# Patient Record
Sex: Female | Born: 1945 | Race: White | Hispanic: No | Marital: Married | Smoking: Never smoker
Health system: Southern US, Community
[De-identification: ages and names within clinical notes are randomized; demographics above are authoritative.]

## PROBLEM LIST (undated history)

## (undated) DIAGNOSIS — R053 Chronic cough: Secondary | ICD-10-CM

## (undated) DIAGNOSIS — J302 Other seasonal allergic rhinitis: Secondary | ICD-10-CM

## (undated) DIAGNOSIS — K5900 Constipation, unspecified: Secondary | ICD-10-CM

## (undated) DIAGNOSIS — G4730 Sleep apnea, unspecified: Secondary | ICD-10-CM

## (undated) DIAGNOSIS — L989 Disorder of the skin and subcutaneous tissue, unspecified: Secondary | ICD-10-CM

## (undated) DIAGNOSIS — I10 Essential (primary) hypertension: Secondary | ICD-10-CM

## (undated) DIAGNOSIS — N3281 Overactive bladder: Secondary | ICD-10-CM

## (undated) DIAGNOSIS — S83242A Other tear of medial meniscus, current injury, left knee, initial encounter: Secondary | ICD-10-CM

## (undated) DIAGNOSIS — K219 Gastro-esophageal reflux disease without esophagitis: Secondary | ICD-10-CM

## (undated) DIAGNOSIS — N951 Menopausal and female climacteric states: Secondary | ICD-10-CM

## (undated) DIAGNOSIS — M1990 Unspecified osteoarthritis, unspecified site: Secondary | ICD-10-CM

## (undated) DIAGNOSIS — L900 Lichen sclerosus et atrophicus: Secondary | ICD-10-CM

## (undated) DIAGNOSIS — M810 Age-related osteoporosis without current pathological fracture: Secondary | ICD-10-CM

## (undated) DIAGNOSIS — M47812 Spondylosis without myelopathy or radiculopathy, cervical region: Secondary | ICD-10-CM

## (undated) DIAGNOSIS — E559 Vitamin D deficiency, unspecified: Secondary | ICD-10-CM

## (undated) DIAGNOSIS — M5416 Radiculopathy, lumbar region: Secondary | ICD-10-CM

## (undated) DIAGNOSIS — Z973 Presence of spectacles and contact lenses: Secondary | ICD-10-CM

## (undated) DIAGNOSIS — M4802 Spinal stenosis, cervical region: Secondary | ICD-10-CM

## (undated) DIAGNOSIS — R600 Localized edema: Secondary | ICD-10-CM

## (undated) DIAGNOSIS — N390 Urinary tract infection, site not specified: Secondary | ICD-10-CM

## (undated) DIAGNOSIS — M5136 Other intervertebral disc degeneration, lumbar region: Secondary | ICD-10-CM

## (undated) DIAGNOSIS — M51369 Other intervertebral disc degeneration, lumbar region without mention of lumbar back pain or lower extremity pain: Secondary | ICD-10-CM

## (undated) DIAGNOSIS — G2581 Restless legs syndrome: Secondary | ICD-10-CM

## (undated) DIAGNOSIS — G8929 Other chronic pain: Secondary | ICD-10-CM

## (undated) DIAGNOSIS — R079 Chest pain, unspecified: Secondary | ICD-10-CM

## (undated) DIAGNOSIS — M545 Low back pain, unspecified: Secondary | ICD-10-CM

## (undated) HISTORY — DX: Other tear of medial meniscus, current injury, left knee, initial encounter: S83.242A

## (undated) HISTORY — DX: Overactive bladder: N32.81

## (undated) HISTORY — PX: ABDOMINAL HYSTERECTOMY: SHX81

## (undated) HISTORY — DX: Other intervertebral disc degeneration, lumbar region: M51.36

## (undated) HISTORY — DX: Menopausal and female climacteric states: N95.1

## (undated) HISTORY — DX: Chronic cough: R05.3

## (undated) HISTORY — DX: Essential (primary) hypertension: I10

## (undated) HISTORY — PX: BUNIONECTOMY: SHX129

## (undated) HISTORY — DX: Age-related osteoporosis without current pathological fracture: M81.0

## (undated) HISTORY — DX: Other intervertebral disc degeneration, lumbar region without mention of lumbar back pain or lower extremity pain: M51.369

## (undated) HISTORY — DX: Restless legs syndrome: G25.81

## (undated) HISTORY — DX: Localized edema: R60.0

## (undated) HISTORY — PX: SHOULDER SURGERY: SHX246

## (undated) HISTORY — DX: Low back pain, unspecified: M54.50

## (undated) HISTORY — DX: Radiculopathy, lumbar region: M54.16

## (undated) HISTORY — PX: OTHER SURGICAL HISTORY: SHX169

## (undated) HISTORY — DX: Chest pain, unspecified: R07.9

## (undated) HISTORY — DX: Vitamin D deficiency, unspecified: E55.9

## (undated) HISTORY — DX: Spondylosis without myelopathy or radiculopathy, cervical region: M47.812

## (undated) HISTORY — DX: Other seasonal allergic rhinitis: J30.2

## (undated) HISTORY — DX: Spinal stenosis, cervical region: M48.02

## (undated) HISTORY — DX: Constipation, unspecified: K59.00

## (undated) HISTORY — DX: Other chronic pain: G89.29

---

## 1996-01-18 HISTORY — PX: CERVICAL SPINE SURGERY: SHX589

## 1998-11-09 ENCOUNTER — Encounter: Admission: RE | Admit: 1998-11-09 | Discharge: 1998-11-09 | Payer: Self-pay | Admitting: Internal Medicine

## 1998-11-09 ENCOUNTER — Encounter: Payer: Self-pay | Admitting: Internal Medicine

## 1999-09-29 ENCOUNTER — Encounter: Payer: Self-pay | Admitting: Internal Medicine

## 1999-09-29 ENCOUNTER — Encounter: Admission: RE | Admit: 1999-09-29 | Discharge: 1999-09-29 | Payer: Self-pay | Admitting: Internal Medicine

## 1999-10-12 ENCOUNTER — Encounter: Payer: Self-pay | Admitting: Internal Medicine

## 1999-10-12 ENCOUNTER — Encounter: Admission: RE | Admit: 1999-10-12 | Discharge: 1999-10-12 | Payer: Self-pay | Admitting: Internal Medicine

## 2006-12-04 ENCOUNTER — Encounter: Admission: RE | Admit: 2006-12-04 | Discharge: 2006-12-04 | Payer: Self-pay | Admitting: Internal Medicine

## 2006-12-06 ENCOUNTER — Encounter: Admission: RE | Admit: 2006-12-06 | Discharge: 2006-12-06 | Payer: Self-pay | Admitting: Internal Medicine

## 2007-01-27 ENCOUNTER — Encounter: Admission: RE | Admit: 2007-01-27 | Discharge: 2007-01-27 | Payer: Self-pay | Admitting: Internal Medicine

## 2008-03-04 ENCOUNTER — Encounter: Admission: RE | Admit: 2008-03-04 | Discharge: 2008-03-04 | Payer: Self-pay | Admitting: Internal Medicine

## 2009-03-24 ENCOUNTER — Encounter: Admission: RE | Admit: 2009-03-24 | Discharge: 2009-03-24 | Payer: Self-pay | Admitting: Internal Medicine

## 2009-08-03 ENCOUNTER — Encounter: Admission: RE | Admit: 2009-08-03 | Discharge: 2009-08-03 | Payer: Self-pay | Admitting: Internal Medicine

## 2010-02-07 ENCOUNTER — Encounter: Payer: Self-pay | Admitting: Internal Medicine

## 2011-05-04 ENCOUNTER — Other Ambulatory Visit: Payer: Self-pay | Admitting: Internal Medicine

## 2011-05-04 DIAGNOSIS — Z1231 Encounter for screening mammogram for malignant neoplasm of breast: Secondary | ICD-10-CM

## 2011-05-17 ENCOUNTER — Ambulatory Visit: Payer: Self-pay

## 2011-07-20 ENCOUNTER — Ambulatory Visit: Payer: Self-pay

## 2011-07-27 ENCOUNTER — Ambulatory Visit
Admission: RE | Admit: 2011-07-27 | Discharge: 2011-07-27 | Disposition: A | Discharge status: Home / Self Care | Payer: Self-pay | Source: Ambulatory Visit | Attending: Internal Medicine | Admitting: Internal Medicine

## 2011-07-27 DIAGNOSIS — Z1231 Encounter for screening mammogram for malignant neoplasm of breast: Secondary | ICD-10-CM

## 2012-01-18 HISTORY — PX: EYE SURGERY: SHX253

## 2012-05-17 ENCOUNTER — Other Ambulatory Visit (HOSPITAL_COMMUNITY)
Admission: RE | Admit: 2012-05-17 | Discharge: 2012-05-17 | Disposition: A | Discharge status: Home / Self Care | Payer: PRIVATE HEALTH INSURANCE | Source: Ambulatory Visit | Attending: Nurse Practitioner | Admitting: Nurse Practitioner

## 2012-05-17 ENCOUNTER — Other Ambulatory Visit: Payer: Self-pay | Admitting: Nurse Practitioner

## 2012-05-17 DIAGNOSIS — Z124 Encounter for screening for malignant neoplasm of cervix: Secondary | ICD-10-CM | POA: Insufficient documentation

## 2012-05-17 DIAGNOSIS — Z1151 Encounter for screening for human papillomavirus (HPV): Secondary | ICD-10-CM | POA: Insufficient documentation

## 2012-06-04 ENCOUNTER — Other Ambulatory Visit: Payer: Self-pay | Admitting: Gastroenterology

## 2012-06-08 ENCOUNTER — Encounter (HOSPITAL_COMMUNITY): Payer: Self-pay | Admitting: *Deleted

## 2012-06-20 ENCOUNTER — Encounter (HOSPITAL_COMMUNITY): Payer: Self-pay | Admitting: Pharmacy Technician

## 2012-07-03 ENCOUNTER — Encounter (HOSPITAL_COMMUNITY): Payer: Self-pay | Admitting: Anesthesiology

## 2012-07-03 ENCOUNTER — Encounter (HOSPITAL_COMMUNITY)
Admission: RE | Disposition: A | Discharge status: Home / Self Care | Payer: Self-pay | Source: Ambulatory Visit | Attending: Gastroenterology

## 2012-07-03 ENCOUNTER — Ambulatory Visit (HOSPITAL_COMMUNITY): Payer: Medicare Other | Admitting: Anesthesiology

## 2012-07-03 ENCOUNTER — Encounter (HOSPITAL_COMMUNITY): Payer: Self-pay

## 2012-07-03 ENCOUNTER — Ambulatory Visit (HOSPITAL_COMMUNITY)
Admission: RE | Admit: 2012-07-03 | Discharge: 2012-07-03 | Disposition: A | Discharge status: Home / Self Care | Payer: Medicare Other | Source: Ambulatory Visit | Attending: Gastroenterology | Admitting: Gastroenterology

## 2012-07-03 DIAGNOSIS — K5730 Diverticulosis of large intestine without perforation or abscess without bleeding: Secondary | ICD-10-CM | POA: Insufficient documentation

## 2012-07-03 DIAGNOSIS — K921 Melena: Secondary | ICD-10-CM | POA: Insufficient documentation

## 2012-07-03 DIAGNOSIS — Z79899 Other long term (current) drug therapy: Secondary | ICD-10-CM | POA: Insufficient documentation

## 2012-07-03 DIAGNOSIS — Z90710 Acquired absence of both cervix and uterus: Secondary | ICD-10-CM | POA: Insufficient documentation

## 2012-07-03 DIAGNOSIS — K219 Gastro-esophageal reflux disease without esophagitis: Secondary | ICD-10-CM | POA: Insufficient documentation

## 2012-07-03 DIAGNOSIS — E785 Hyperlipidemia, unspecified: Secondary | ICD-10-CM | POA: Insufficient documentation

## 2012-07-03 HISTORY — PX: COLONOSCOPY WITH PROPOFOL: SHX5780

## 2012-07-03 HISTORY — DX: Unspecified osteoarthritis, unspecified site: M19.90

## 2012-07-03 HISTORY — DX: Lichen sclerosus et atrophicus: L90.0

## 2012-07-03 HISTORY — DX: Gastro-esophageal reflux disease without esophagitis: K21.9

## 2012-07-03 SURGERY — COLONOSCOPY WITH PROPOFOL
Anesthesia: Monitor Anesthesia Care

## 2012-07-03 MED ORDER — SODIUM CHLORIDE 0.9 % IV SOLN: INTRAVENOUS | Status: DC

## 2012-07-03 MED ORDER — PROPOFOL INFUSION 10 MG/ML OPTIME
INTRAVENOUS | Status: DC | PRN
  Administered 2012-07-03: 120 ug/kg/min via INTRAVENOUS

## 2012-07-03 MED ORDER — PROPOFOL 10 MG/ML IV BOLUS
INTRAVENOUS | Status: DC | PRN
  Administered 2012-07-03: 30 mg via INTRAVENOUS

## 2012-07-03 MED ORDER — KETAMINE HCL 10 MG/ML IJ SOLN
INTRAMUSCULAR | Status: DC | PRN
  Administered 2012-07-03: 20 mg via INTRAVENOUS

## 2012-07-03 MED ORDER — LACTATED RINGERS IV SOLN
INTRAVENOUS | Status: DC | PRN
  Administered 2012-07-03: 08:00:00 via INTRAVENOUS

## 2012-07-03 SURGICAL SUPPLY — 21 items

## 2012-07-03 NOTE — H&P (Signed)
  Problem: Resolved hematochezia on aspirin.  History: The patient is a 67 year old female born 07/03/1945. The patient tells me she [place] ago. In February 2011, the patient underwent a single contrast barium enema which showed colonic diverticulosis.  The patient has spotted blood on her underwear followed by a few episodes of more significant lower gastrointestinal bleeding manifested by blood soiling her underwear. She reports no abdominal pain, rectal pain, or anal pain. She reports no bleeding with bowel movements. She recently underwent a gynecologic exam with biopsies performed.  The patient thinks her bleeding came from the gastrointestinal tract.  The patient is scheduled to undergo a diagnostic colonoscopy.  Past medical and surgical history: Hyperlipidemia. Allergic rhinitis. Vitamin D. deficiency. Gout. Colonic diverticulosis degenerative joint disease. Restless leg syndrome. Gastroesophageal reflux. Total abdominal hysterectomy.  Medication allergies: Lodine. Hydrochlorothiazide. Penicillin. Diclofenac.  Exam: The patient is alert and lying comfortably on the endoscopy stretcher. Abdomen is soft, flat, and nontender to palpation. Cardiac exam reveals a regular rhythm. Lungs are clear to auscultation.  Plan: Proceed with diagnostic colonoscopy.

## 2012-07-03 NOTE — Preoperative (Signed)
Beta Blockers   Reason not to administer Beta Blockers:Not Applicable 

## 2012-07-03 NOTE — Transfer of Care (Signed)
Immediate Anesthesia Transfer of Care Note  Patient: Dana Velazquez  Procedure(s) Performed: Procedure(s): COLONOSCOPY WITH PROPOFOL (N/A)  Patient Location: PACU  Anesthesia Type:MAC  Level of Consciousness: awake, alert , oriented and patient cooperative  Airway & Oxygen Therapy: Patient Spontanous Breathing and Patient connected to face mask oxygen  Post-op Assessment: Report given to PACU RN and Post -op Vital signs reviewed and stable  Post vital signs: Reviewed and stable  Complications: No apparent anesthesia complications

## 2012-07-03 NOTE — Anesthesia Preprocedure Evaluation (Addendum)
Anesthesia Evaluation  Patient identified by MRN, date of birth, ID band Patient awake    Reviewed: Allergy & Precautions, H&P , NPO status , Patient's Chart, lab work & pertinent test results  Airway       Dental   Pulmonary neg pulmonary ROS,          Cardiovascular negative cardio ROS      Neuro/Psych negative neurological ROS  negative psych ROS   GI/Hepatic Neg liver ROS, GERD-  Medicated,  Endo/Other  negative endocrine ROS  Renal/GU negative Renal ROS     Musculoskeletal negative musculoskeletal ROS (+)   Abdominal   Peds  Hematology negative hematology ROS (+)   Anesthesia Other Findings   Reproductive/Obstetrics negative OB ROS                           Anesthesia Physical Anesthesia Plan Anesthesia Quick Evaluation

## 2012-07-03 NOTE — Op Note (Signed)
Procedure: Diagnostic colonoscopy to evaluate resolved hematochezia  Endoscopist: Danise Edge  Premedication: Propofol administered by anesthesia  Procedure: The patient was placed in the left lateral decubitus position. Anal inspection and digital rectal exam were normal. The Pentax pediatric colonoscope was introduced into the rectum and advanced to the cecum. A normal-appearing ileocecal valve and appendiceal orifice were identified. Colonic preparation for the exam today was good.  Rectum. Normal. Retroflexed view of the distal rectum normal.  Sigmoid colon and descending colon. Sigmoid colonic diverticulosis.  Splenic flexure. Normal.  Transverse colon. Normal.  Hepatic flexure. Normal.  Ascending colon. Normal.  Cecum and ileocecal valve. Normal.  Assessment: Normal diagnostic proctocolonoscopy to the cecum except for the presence of sigmoid colonic diverticulosis.  Recommendations: Schedule repeat screening colonoscopy in 10 years if his health allows.

## 2012-07-03 NOTE — Anesthesia Postprocedure Evaluation (Signed)
Anesthesia Post Note  Patient: Dana Velazquez  Procedure(s) Performed: Procedure(s) (LRB): COLONOSCOPY WITH PROPOFOL (N/A)  Anesthesia type: MAC  Patient location: PACU  Post pain: Pain level controlled  Post assessment: Post-op Vital signs reviewed  Last Vitals: BP 121/45  Pulse 67  Temp(Src) 36.7 C (Oral)  Resp 12  Ht 5' (1.524 m)  Wt 181 lb (82.101 kg)  BMI 35.35 kg/m2  SpO2 100%  Post vital signs: Reviewed  Level of consciousness: awake  Complications: No apparent anesthesia complications

## 2012-07-04 ENCOUNTER — Encounter (HOSPITAL_COMMUNITY): Payer: Self-pay | Admitting: Gastroenterology

## 2012-07-06 ENCOUNTER — Other Ambulatory Visit: Payer: Self-pay

## 2012-07-06 DIAGNOSIS — Z1231 Encounter for screening mammogram for malignant neoplasm of breast: Secondary | ICD-10-CM

## 2012-07-30 ENCOUNTER — Ambulatory Visit: Payer: Medicare Other

## 2012-07-31 ENCOUNTER — Ambulatory Visit
Admission: RE | Admit: 2012-07-31 | Discharge: 2012-07-31 | Disposition: A | Discharge status: Home / Self Care | Payer: Medicare Other | Source: Ambulatory Visit

## 2012-07-31 DIAGNOSIS — Z1231 Encounter for screening mammogram for malignant neoplasm of breast: Secondary | ICD-10-CM

## 2013-07-09 ENCOUNTER — Other Ambulatory Visit: Payer: Self-pay

## 2013-07-09 DIAGNOSIS — Z1231 Encounter for screening mammogram for malignant neoplasm of breast: Secondary | ICD-10-CM

## 2013-08-05 ENCOUNTER — Ambulatory Visit
Admission: RE | Admit: 2013-08-05 | Discharge: 2013-08-05 | Disposition: A | Discharge status: Home / Self Care | Payer: 59 | Source: Ambulatory Visit

## 2013-08-05 DIAGNOSIS — Z1231 Encounter for screening mammogram for malignant neoplasm of breast: Secondary | ICD-10-CM

## 2014-01-12 IMAGING — MG MM DIGITAL SCREENING BILAT W/ CAD
6 series · 6 of 6 positions shown · non-contrast
Comparison: Previous exam(s).

CLINICAL DATA: Screening.

DIGITAL SCREENING BILATERAL MAMMOGRAM WITH CAD

[R CC]
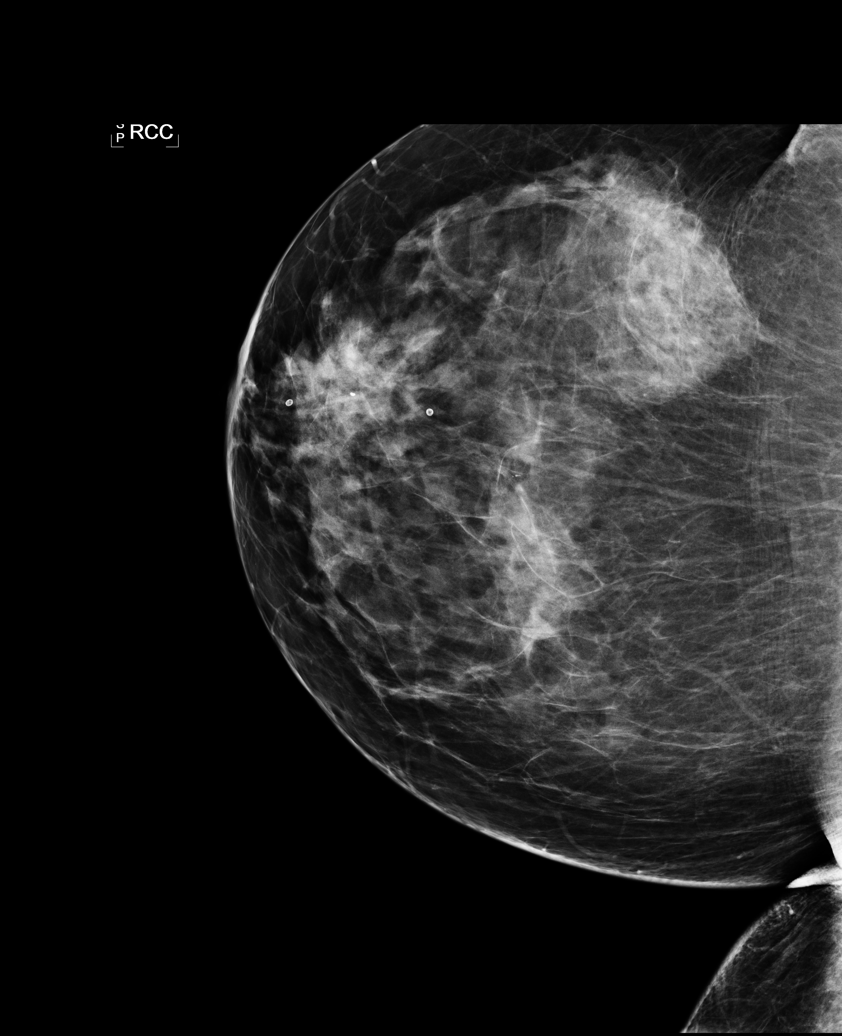

[L CC]
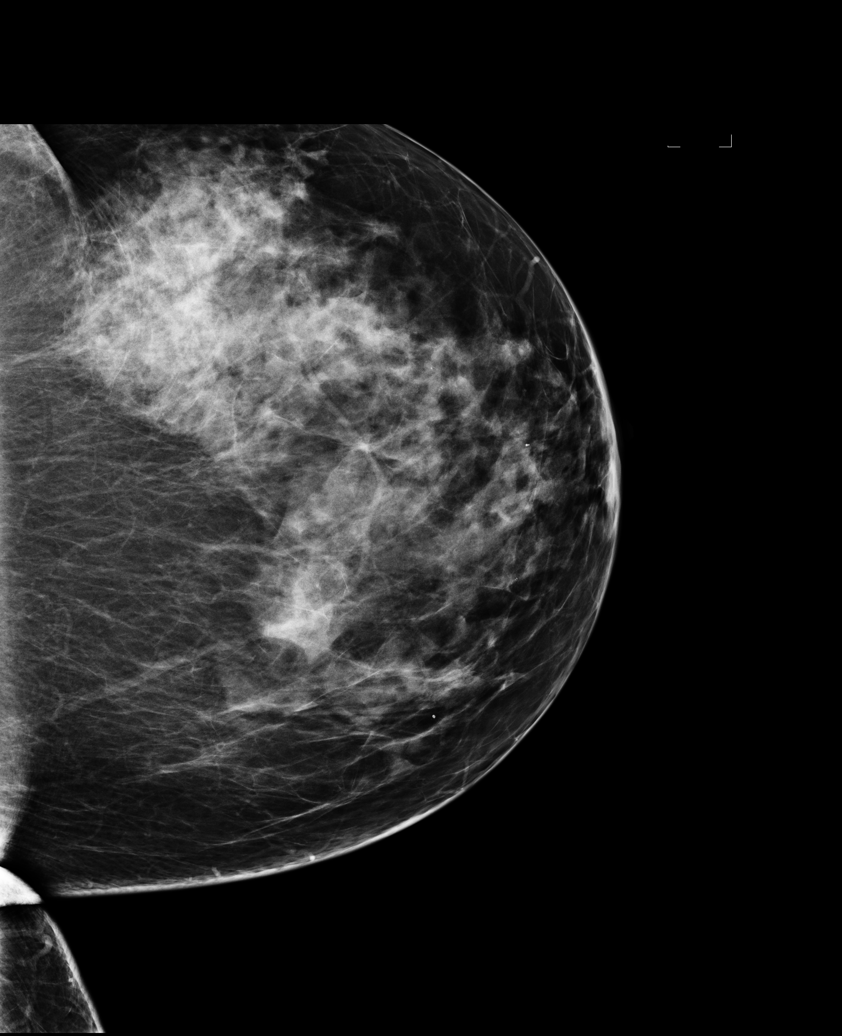

[L MLO]
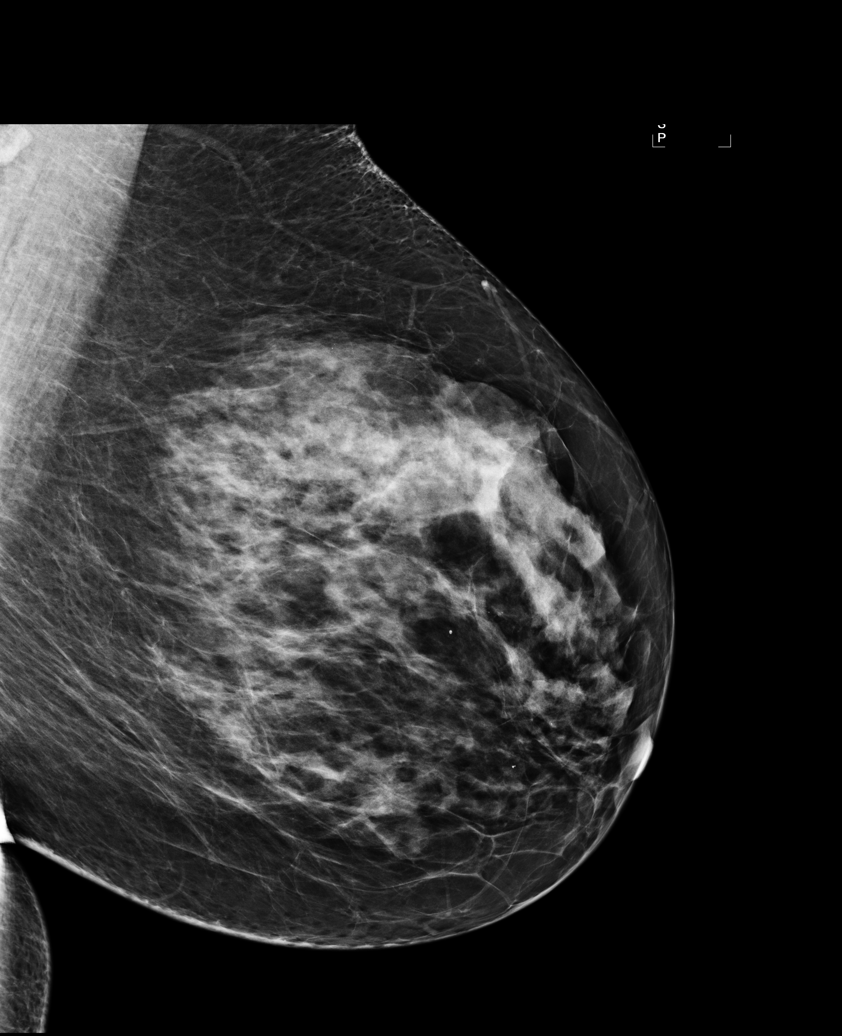

[R MLO]
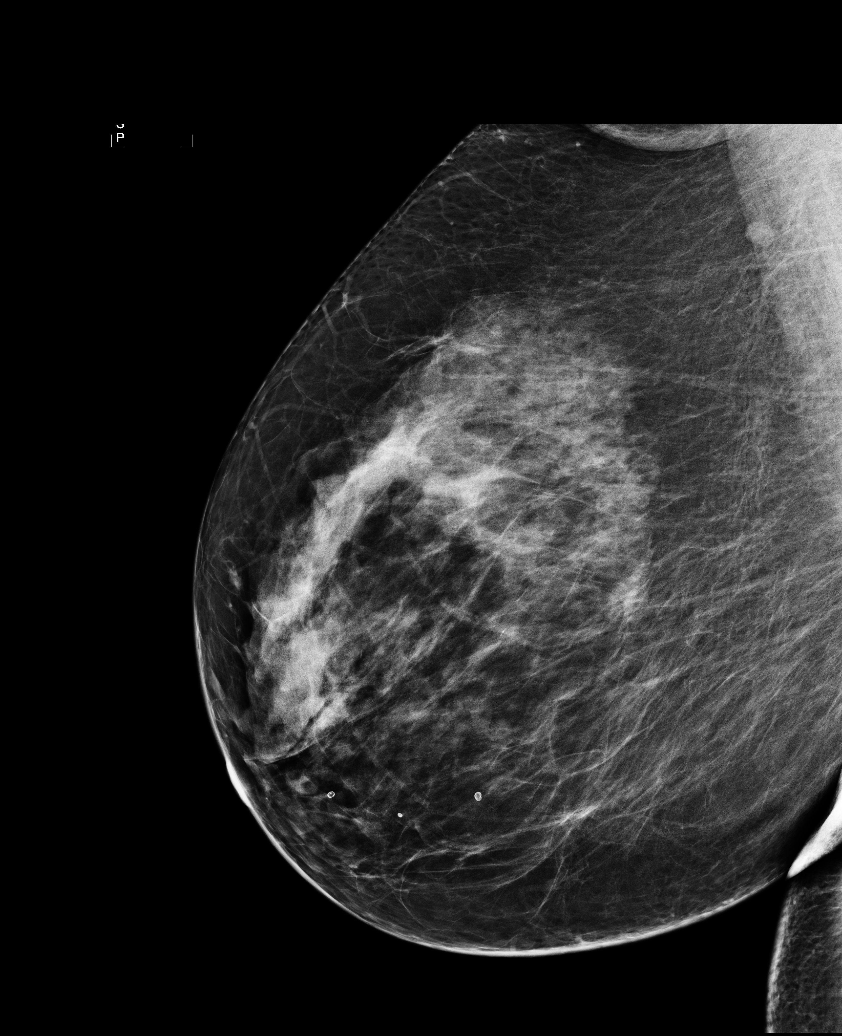

[L XCCL]
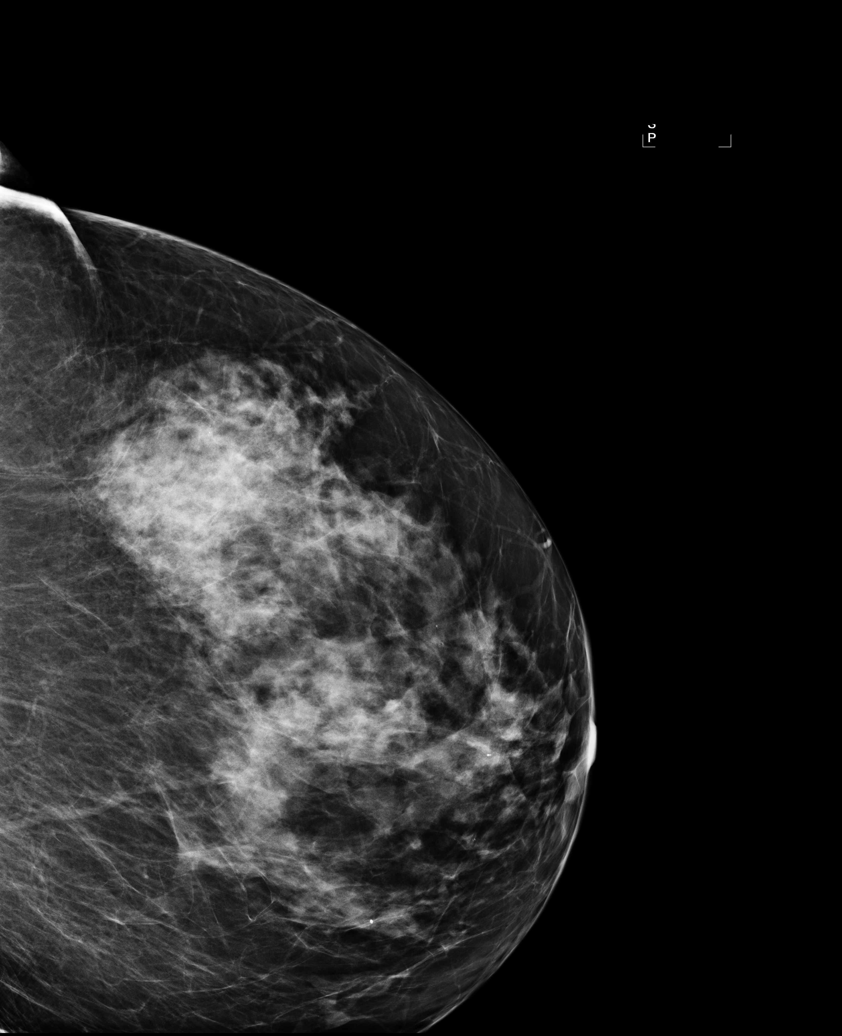

[R XCCL]
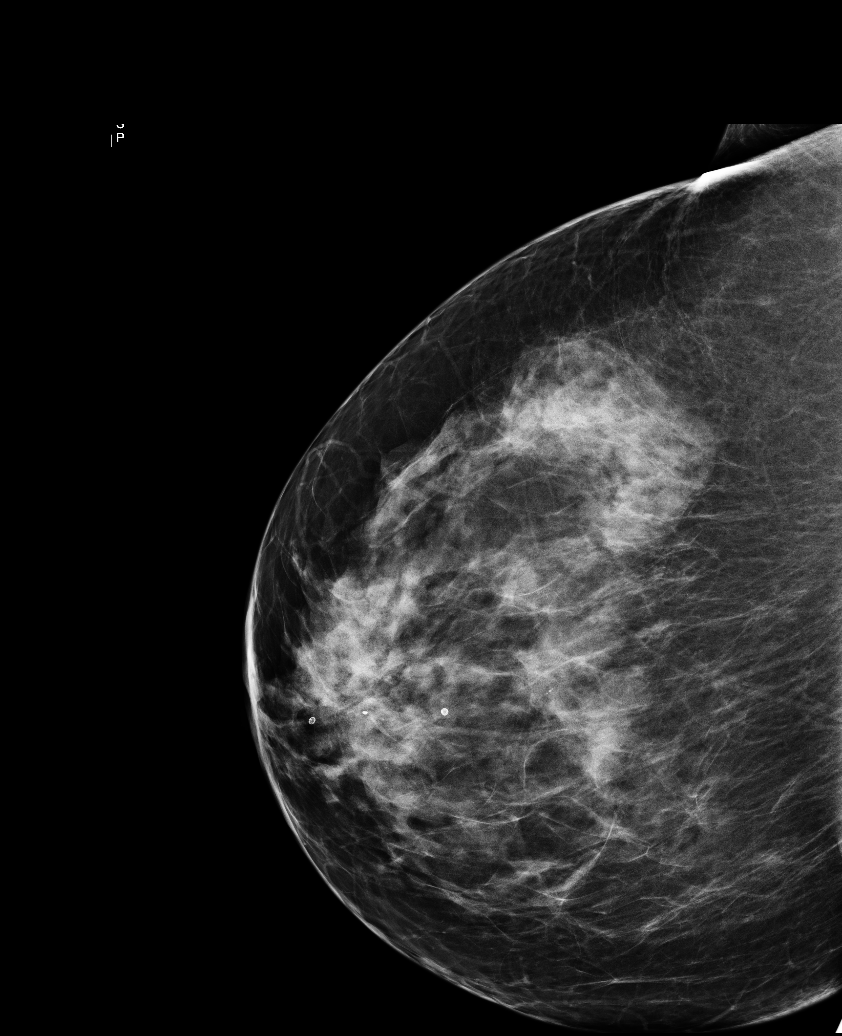

[6 of 6 positions shown; findings below may reference images not displayed]

FINDINGS: ACR Breast Density Category c:  The breast tissue is
heterogeneously dense, which may obscure small masses.

There are no findings suspicious for malignancy.

Images were processed with CAD.
IMPRESSION: No mammographic evidence of malignancy.

A result letter of this screening mammogram will be mailed directly
to the patient.

RECOMMENDATION:
Screening mammogram in one year. (Code:HG-[id number])

BI-RADS CATEGORY 1:  Negative.

## 2014-10-10 HISTORY — PX: KNEE SURGERY: SHX244

## 2016-07-26 DIAGNOSIS — G8929 Other chronic pain: Secondary | ICD-10-CM

## 2016-07-26 DIAGNOSIS — M79672 Pain in left foot: Secondary | ICD-10-CM | POA: Insufficient documentation

## 2016-07-26 DIAGNOSIS — M19072 Primary osteoarthritis, left ankle and foot: Secondary | ICD-10-CM

## 2016-07-26 HISTORY — DX: Primary osteoarthritis, left ankle and foot: M19.072

## 2016-07-26 HISTORY — DX: Other chronic pain: G89.29

## 2016-11-23 DIAGNOSIS — M79609 Pain in unspecified limb: Secondary | ICD-10-CM

## 2016-11-23 DIAGNOSIS — Z01818 Encounter for other preprocedural examination: Secondary | ICD-10-CM | POA: Diagnosis not present

## 2016-12-13 HISTORY — PX: REPLACEMENT TOTAL KNEE: SUR1224

## 2017-03-20 DIAGNOSIS — H608X2 Other otitis externa, left ear: Secondary | ICD-10-CM | POA: Insufficient documentation

## 2017-03-20 DIAGNOSIS — H6122 Impacted cerumen, left ear: Secondary | ICD-10-CM | POA: Insufficient documentation

## 2017-03-20 HISTORY — DX: Other otitis externa, left ear: H60.8X2

## 2017-03-20 HISTORY — DX: Impacted cerumen, left ear: H61.22

## 2018-10-03 DIAGNOSIS — I10 Essential (primary) hypertension: Secondary | ICD-10-CM

## 2018-10-03 DIAGNOSIS — K229 Disease of esophagus, unspecified: Secondary | ICD-10-CM

## 2018-10-03 DIAGNOSIS — R109 Unspecified abdominal pain: Secondary | ICD-10-CM | POA: Diagnosis not present

## 2018-10-03 DIAGNOSIS — M4854XA Collapsed vertebra, not elsewhere classified, thoracic region, initial encounter for fracture: Secondary | ICD-10-CM | POA: Diagnosis not present

## 2018-10-03 DIAGNOSIS — K5901 Slow transit constipation: Secondary | ICD-10-CM | POA: Diagnosis not present

## 2018-10-03 DIAGNOSIS — N390 Urinary tract infection, site not specified: Secondary | ICD-10-CM | POA: Diagnosis not present

## 2018-10-04 DIAGNOSIS — R109 Unspecified abdominal pain: Secondary | ICD-10-CM | POA: Diagnosis not present

## 2018-10-04 DIAGNOSIS — N390 Urinary tract infection, site not specified: Secondary | ICD-10-CM | POA: Diagnosis not present

## 2018-10-04 DIAGNOSIS — M4854XA Collapsed vertebra, not elsewhere classified, thoracic region, initial encounter for fracture: Secondary | ICD-10-CM | POA: Diagnosis not present

## 2018-10-04 DIAGNOSIS — K5901 Slow transit constipation: Secondary | ICD-10-CM | POA: Diagnosis not present

## 2018-10-05 DIAGNOSIS — N390 Urinary tract infection, site not specified: Secondary | ICD-10-CM | POA: Diagnosis not present

## 2018-10-05 DIAGNOSIS — K5901 Slow transit constipation: Secondary | ICD-10-CM | POA: Diagnosis not present

## 2018-10-05 DIAGNOSIS — R109 Unspecified abdominal pain: Secondary | ICD-10-CM | POA: Diagnosis not present

## 2018-10-05 DIAGNOSIS — M4854XA Collapsed vertebra, not elsewhere classified, thoracic region, initial encounter for fracture: Secondary | ICD-10-CM | POA: Diagnosis not present

## 2018-10-06 DIAGNOSIS — M4854XA Collapsed vertebra, not elsewhere classified, thoracic region, initial encounter for fracture: Secondary | ICD-10-CM | POA: Diagnosis not present

## 2018-10-06 DIAGNOSIS — R109 Unspecified abdominal pain: Secondary | ICD-10-CM | POA: Diagnosis not present

## 2018-10-06 DIAGNOSIS — K5901 Slow transit constipation: Secondary | ICD-10-CM | POA: Diagnosis not present

## 2018-10-06 DIAGNOSIS — N390 Urinary tract infection, site not specified: Secondary | ICD-10-CM | POA: Diagnosis not present

## 2018-10-07 DIAGNOSIS — R109 Unspecified abdominal pain: Secondary | ICD-10-CM | POA: Diagnosis not present

## 2018-10-07 DIAGNOSIS — K5901 Slow transit constipation: Secondary | ICD-10-CM | POA: Diagnosis not present

## 2018-10-07 DIAGNOSIS — M4854XA Collapsed vertebra, not elsewhere classified, thoracic region, initial encounter for fracture: Secondary | ICD-10-CM | POA: Diagnosis not present

## 2018-10-07 DIAGNOSIS — N390 Urinary tract infection, site not specified: Secondary | ICD-10-CM | POA: Diagnosis not present

## 2019-08-18 HISTORY — PX: CATARACT EXTRACTION, BILATERAL: SHX1313

## 2019-12-17 DIAGNOSIS — M7670 Peroneal tendinitis, unspecified leg: Secondary | ICD-10-CM

## 2019-12-17 HISTORY — DX: Peroneal tendinitis, unspecified leg: M76.70

## 2020-01-18 HISTORY — PX: OTHER SURGICAL HISTORY: SHX169

## 2020-02-25 ENCOUNTER — Encounter: Payer: Self-pay | Admitting: *Deleted

## 2020-02-25 ENCOUNTER — Encounter: Payer: Self-pay | Admitting: Cardiology

## 2020-02-25 DIAGNOSIS — M4802 Spinal stenosis, cervical region: Secondary | ICD-10-CM | POA: Insufficient documentation

## 2020-02-25 DIAGNOSIS — M5416 Radiculopathy, lumbar region: Secondary | ICD-10-CM | POA: Insufficient documentation

## 2020-02-25 DIAGNOSIS — R053 Chronic cough: Secondary | ICD-10-CM | POA: Insufficient documentation

## 2020-02-25 DIAGNOSIS — G8929 Other chronic pain: Secondary | ICD-10-CM | POA: Insufficient documentation

## 2020-02-25 DIAGNOSIS — N3281 Overactive bladder: Secondary | ICD-10-CM | POA: Insufficient documentation

## 2020-02-25 DIAGNOSIS — R600 Localized edema: Secondary | ICD-10-CM | POA: Insufficient documentation

## 2020-02-25 DIAGNOSIS — M5136 Other intervertebral disc degeneration, lumbar region: Secondary | ICD-10-CM | POA: Insufficient documentation

## 2020-02-25 DIAGNOSIS — I10 Essential (primary) hypertension: Secondary | ICD-10-CM | POA: Insufficient documentation

## 2020-02-25 DIAGNOSIS — M810 Age-related osteoporosis without current pathological fracture: Secondary | ICD-10-CM | POA: Insufficient documentation

## 2020-02-25 DIAGNOSIS — M1990 Unspecified osteoarthritis, unspecified site: Secondary | ICD-10-CM | POA: Insufficient documentation

## 2020-02-25 DIAGNOSIS — E559 Vitamin D deficiency, unspecified: Secondary | ICD-10-CM | POA: Insufficient documentation

## 2020-02-25 DIAGNOSIS — L900 Lichen sclerosus et atrophicus: Secondary | ICD-10-CM | POA: Insufficient documentation

## 2020-02-25 DIAGNOSIS — K219 Gastro-esophageal reflux disease without esophagitis: Secondary | ICD-10-CM | POA: Insufficient documentation

## 2020-02-25 DIAGNOSIS — J302 Other seasonal allergic rhinitis: Secondary | ICD-10-CM | POA: Insufficient documentation

## 2020-02-25 DIAGNOSIS — M47812 Spondylosis without myelopathy or radiculopathy, cervical region: Secondary | ICD-10-CM | POA: Insufficient documentation

## 2020-02-25 DIAGNOSIS — R079 Chest pain, unspecified: Secondary | ICD-10-CM | POA: Insufficient documentation

## 2020-02-25 DIAGNOSIS — G2581 Restless legs syndrome: Secondary | ICD-10-CM | POA: Insufficient documentation

## 2020-02-25 DIAGNOSIS — K5900 Constipation, unspecified: Secondary | ICD-10-CM | POA: Insufficient documentation

## 2020-02-25 DIAGNOSIS — S83242A Other tear of medial meniscus, current injury, left knee, initial encounter: Secondary | ICD-10-CM | POA: Insufficient documentation

## 2020-02-25 DIAGNOSIS — N951 Menopausal and female climacteric states: Secondary | ICD-10-CM | POA: Insufficient documentation

## 2020-02-27 ENCOUNTER — Other Ambulatory Visit: Payer: Self-pay

## 2020-02-27 ENCOUNTER — Encounter: Payer: Self-pay | Admitting: Cardiology

## 2020-02-27 ENCOUNTER — Ambulatory Visit (INDEPENDENT_AMBULATORY_CARE_PROVIDER_SITE_OTHER): Payer: Medicare Other | Admitting: Cardiology

## 2020-02-27 VITALS — BP 120/80 | HR 50 | Ht 59.0 in | Wt 167.0 lb

## 2020-02-27 DIAGNOSIS — E782 Mixed hyperlipidemia: Secondary | ICD-10-CM | POA: Insufficient documentation

## 2020-02-27 DIAGNOSIS — E66811 Obesity, class 1: Secondary | ICD-10-CM

## 2020-02-27 DIAGNOSIS — E669 Obesity, unspecified: Secondary | ICD-10-CM

## 2020-02-27 DIAGNOSIS — I10 Essential (primary) hypertension: Secondary | ICD-10-CM

## 2020-02-27 DIAGNOSIS — I700 Atherosclerosis of aorta: Secondary | ICD-10-CM

## 2020-02-27 DIAGNOSIS — I2510 Atherosclerotic heart disease of native coronary artery without angina pectoris: Secondary | ICD-10-CM

## 2020-02-27 DIAGNOSIS — I209 Angina pectoris, unspecified: Secondary | ICD-10-CM

## 2020-02-27 HISTORY — DX: Angina pectoris, unspecified: I20.9

## 2020-02-27 HISTORY — DX: Mixed hyperlipidemia: E78.2

## 2020-02-27 HISTORY — DX: Obesity, class 1: E66.811

## 2020-02-27 HISTORY — DX: Obesity, unspecified: E66.9

## 2020-02-27 MED ORDER — METOPROLOL TARTRATE 50 MG PO TABS: 50.0000 mg | ORAL_TABLET | Freq: Once | ORAL | 0 refills | Status: DC

## 2020-02-27 NOTE — Progress Notes (Signed)
Cardiology Office Note:    Date:  02/27/2020   ID:  Dana Velazquez, DOB 1945/05/17, MRN [id number]  PCP:  Paulina Fusi, MD  Cardiologist:  Garwin Brothers, MD   Referring MD: Paulina Fusi, MD    ASSESSMENT:    1. Benign essential hypertension   2. Angina pectoris (HCC)   3. Mixed dyslipidemia   4. Obesity (BMI 30.0-34.9)    PLAN:    In order of problems listed above:  1. Primary prevention stressed with patient.  Importance of compliance with diet medication stressed and she vocalized understanding. 2. Angina pectoris: Patient symptoms are concerning.  I discussed modalities of evaluation invasive and noninvasive.  Patient prefers CT coronary angiography with FFR and I discussed benefits and risks and she is agreeable and we will set her up for the same.  In the interim if she has any chest pain that is significant she knows to go to the nearest emergency room. 3. Essential hypertension: Blood pressure stable and diet was emphasized. 4. Mixed dyslipidemia: On statin therapy.  Lipids managed by primary care physician. 5. Obesity: Weight reduction stressed.  Diet emphasized.  If the above tests are negative I told her to begin an exercise program and she is agreeable.  Risks of obesity explained. 6. Patient will be seen in follow-up appointment in 3 months or earlier if the patient has any concerns    Medication Adjustments/Labs and Tests Ordered: Current medicines are reviewed at length with the patient today.  Concerns regarding medicines are outlined above.  Orders Placed This Encounter  Procedures  . CT CORONARY MORPH W/CTA COR W/SCORE W/CA W/CM &/OR WO/CM  . CT CORONARY FRACTIONAL FLOW RESERVE DATA PREP  . CT CORONARY FRACTIONAL FLOW RESERVE FLUID ANALYSIS  . EKG 12-Lead   Meds ordered this encounter  Medications  . metoprolol tartrate (LOPRESSOR) 50 MG tablet    Sig: Take 1 tablet (50 mg total) by mouth once for 1 dose. 2 hours before ct    Dispense:  1  tablet    Refill:  0     History of Present Illness:    Dana Velazquez is a 75 y.o. female who is being seen today for the evaluation of chest pain at the request of Paulina Fusi, MD.  Patient is a pleasant 75 year old female.  She has past medical history of essential hypertension, dyslipidemia and obesity.  She leads a sedentary lifestyle.  She mentions to me that he occasionally has chest tightness which may or may not be related to exertion.  No orthopnea or PND.  No radiation to any part of the body.  His concerns are.  Any times it happens with exertion also.  This is the reason f her primary care provider sent her for evaluation.  At the time of my evaluation, the patient is alert awake oriented and in no distress.  Past Medical History:  Diagnosis Date  . Acute medial meniscus tear of left knee   . Allergic rhinitis, seasonal   . Arthritis    right big toe  . Benign essential hypertension   . Cervical spine arthritis   . Chest pain   . Chronic cough   . Chronic eczematous otitis externa of left ear 03/20/2017  . Chronic pain in left foot 07/26/2016  . Chronic right-sided low back pain without sciatica   . Constipation   . DDD (degenerative disc disease), lumbar   . Degenerative cervical spinal stenosis  Sever at C6, C7  . Edema of left lower extremity   . GERD (gastroesophageal reflux disease)   . Impacted cerumen of left ear 03/20/2017  . Lichen sclerosus    inside vagina  . Lumbar radiculopathy   . Menopausal flushing   . OAB (overactive bladder)   . Osteoporosis of multiple sites   . Peroneal tendinitis 12/17/2019  . Primary osteoarthritis of left foot 07/26/2016  . RLS (restless legs syndrome)   . Vitamin D deficiency     Past Surgical History:  Procedure Laterality Date  . ABDOMINAL HYSTERECTOMY  1988  . BUNIONECTOMY Bilateral    Dr. Kaylyn Layer  . CATARACT EXTRACTION, BILATERAL  08/2019  . COLONOSCOPY WITH PROPOFOL N/A 07/03/2012   Procedure: COLONOSCOPY WITH  PROPOFOL;  Surgeon: Charolett Bumpers, MD;  Location: [facility];  Service: Endoscopy;  Laterality: N/A;  . EYE SURGERY Left 2014   To open tear duct  . KNEE SURGERY Left 10/10/2014   Dr. Liliane Channel medial meniscectomy with chondroplasty of medial femoral condyle  . LAMINECTOMY  1998   C6-7 by Dr. Deneen Harts  . REPLACEMENT TOTAL KNEE Left 12/13/2016   Dr. Deberah Castle  . SHOULDER SURGERY Left 1998    Current Medications: Current Meds  Medication Sig  . alendronate (FOSAMAX) 70 MG tablet Take 1 tablet by mouth once a week.  Marland Kitchen aspirin EC 81 MG tablet Take 81 mg by mouth daily. Swallow whole.  Marland Kitchen atorvastatin (LIPITOR) 40 MG tablet Take 40 mg by mouth at bedtime.  . Calcium Carb-Cholecalciferol (CALCIUM-VITAMIN D3) 600-200 MG-UNIT TABS Take 1 tablet by mouth daily.  . Cholecalciferol (VITAMIN D) 2000 UNITS tablet Take 2,000 Units by mouth daily.  . clobetasol cream (TEMOVATE) 0.05 % Apply 1 application topically every other day. For itching  . esomeprazole (NEXIUM) 40 MG capsule Take 1 capsule by mouth daily.  Marland Kitchen lisinopril (ZESTRIL) 10 MG tablet Take 10 mg by mouth daily.  . meloxicam (MOBIC) 15 MG tablet Take 15 mg by mouth daily.  . metoprolol tartrate (LOPRESSOR) 50 MG tablet Take 1 tablet (50 mg total) by mouth once for 1 dose. 2 hours before ct  . vitamin B-12 (CYANOCOBALAMIN) 1000 MCG tablet Take 1,000 mcg by mouth daily.     Allergies:   Penicillins   Social History   Socioeconomic History  . Marital status: Married    Spouse name: Not on file  . Number of children: Not on file  . Years of education: Not on file  . Highest education level: Not on file  Occupational History  . Not on file  Tobacco Use  . Smoking status: Never Smoker  . Smokeless tobacco: Never Used  Substance and Sexual Activity  . Alcohol use: Not Currently  . Drug use: No  . Sexual activity: Not on file  Other Topics Concern  . Not on file  Social History Narrative  . Not on file   Social Determinants  of Health   Financial Resource Strain: Not on file  Food Insecurity: Not on file  Transportation Needs: Not on file  Physical Activity: Not on file  Stress: Not on file  Social Connections: Not on file     Family History: The patient's family history includes CAD in her father; COPD in her mother; Heart attack in her father; Stomach cancer in her maternal grandmother.  ROS:   Please see the history of present illness.    All other systems reviewed and are negative.  EKGs/Labs/Other Studies Reviewed:  The following studies were reviewed today: EKG reveals sinus rhythm right bundle branch block and nonspecific ST-T changes   Recent Labs: No results found for requested labs within last 8760 hours.  Recent Lipid Panel No results found for: CHOL, TRIG, HDL, CHOLHDL, VLDL, LDLCALC, LDLDIRECT  Physical Exam:    VS:  BP 102/76 (BP Location: Left Arm, Patient Position: Sitting)   Pulse (!) 50   Ht 4\' 11"  (1.499 m)   Wt 167 lb (75.8 kg)   SpO2 94%   BMI 33.73 kg/m     Wt Readings from Last 3 Encounters:  02/27/20 167 lb (75.8 kg)  02/19/20 167 lb (75.8 kg)  07/03/12 181 lb (82.1 kg)     GEN: Patient is in no acute distress HEENT: Normal NECK: No JVD; No carotid bruits LYMPHATICS: No lymphadenopathy CARDIAC: S1 S2 regular, 2/6 systolic murmur at the apex. RESPIRATORY:  Clear to auscultation without rales, wheezing or rhonchi  ABDOMEN: Soft, non-tender, non-distended MUSCULOSKELETAL:  No edema; No deformity  SKIN: Warm and dry NEUROLOGIC:  Alert and oriented x 3 PSYCHIATRIC:  Normal affect    Signed, 07/05/12, MD  02/27/2020 2:48 PM    [facility]

## 2020-02-27 NOTE — Patient Instructions (Addendum)
Medication Instructions:  Your physician recommends that you continue on your current medications as directed. Please refer to the Current Medication list given to you today.  *If you need a refill on your cardiac medications before your next appointment, please call your pharmacy*   Lab Work: Your physician recommends that you return for lab work 3-7 DAYS BEFORE CT: BMP  If you have labs (blood work) drawn today and your tests are completely normal, you will receive your results only by: Marland Kitchen MyChart Message (if you have MyChart) OR . A paper copy in the mail If you have any lab test that is abnormal or we need to change your treatment, we will call you to review the results.   Testing/Procedures: Your cardiac CT will be scheduled at one of the below locations:   [facility] [address] [place], [place] [address] [phone]  OR  [facility] [address] [place], [place] [address] [phone]  If scheduled at [facility], please arrive at the [facility] main entrance (entrance A) of [facility] 30 minutes prior to test start time. Proceed to the [facility] Radiology Department (first floor) to check-in and test prep.  If scheduled at [facility], please arrive 15 mins early for check-in and test prep.  Please follow these instructions carefully (unless otherwise directed):   On the Night Before the Test: . Be sure to Drink plenty of water. . Do not consume any caffeinated/decaffeinated beverages or chocolate 12 hours prior to your test. . Do not take any antihistamines 12 hours prior to your test.  On the Day of the Test: . Drink plenty of water until 1 hour prior to the test. . Do not eat any food 4 hours prior to the test. . You may take your regular medications prior to the test.  . Take metoprolol (Lopressor) two hours prior to test. . FEMALES- please wear  underwire-free bra if available         After the Test: . Drink plenty of water. . After receiving IV contrast, you may experience a mild flushed feeling. This is normal. . On occasion, you may experience a mild rash up to 24 hours after the test. This is not dangerous. If this occurs, you can take Benadryl 25 mg and increase your fluid intake. . If you experience trouble breathing, this can be serious. If it is severe call 911 IMMEDIATELY. If it is mild, please call our office. . If you take any of these medications: Glipizide/Metformin, Avandament, Glucavance, please do not take 48 hours after completing test unless otherwise instructed.   Once we have confirmed authorization from your insurance company, we will call you to set up a date and time for your test. Based on how quickly your insurance processes prior authorizations requests, please allow up to 4 weeks to be contacted for scheduling your Cardiac CT appointment. Be advised that routine Cardiac CT appointments could be scheduled as many as 8 weeks after your provider has ordered it.  For non-scheduling related questions, please contact the cardiac imaging nurse navigator should you have any questions/concerns: Rockwell Alexandria, Cardiac Imaging Nurse Navigator Larey Brick, Cardiac Imaging Nurse Navigator [facility] Direct Office Dial: [phone]   For scheduling needs, including cancellations and rescheduling, please call [place], [phone].     Follow-Up: At [facility], you and your health needs are our priority.  As part of our  continuing mission to provide you with exceptional heart care, we have created designated Provider Care Teams.  These Care Teams include your primary Cardiologist (physician) and Advanced Practice Providers (APPs -  Physician Assistants and Nurse Practitioners) who all work together to provide you with the care you need, when you need it.  We recommend signing up  for the patient portal called "MyChart".  Sign up information is provided on this After Visit Summary.  MyChart is used to connect with patients for Virtual Visits (Telemedicine).  Patients are able to view lab/test results, encounter notes, upcoming appointments, etc.  Non-urgent messages can be sent to your provider as well.   To learn more about what you can do with MyChart, go to [email or web address].    Your next appointment:   3 month(s)  The format for your next appointment:   In Person  Provider:   Belva Crome, MD   Other Instructions  Cardiac CT Angiogram A cardiac CT angiogram is a procedure to look at the heart and the area around the heart. It may be done to help find the cause of chest pains or other symptoms of heart disease. During this procedure, a substance called contrast dye is injected into the blood vessels in the area to be checked. A large X-ray machine, called a CT scanner, then takes detailed pictures of the heart and the surrounding area. The procedure is also sometimes called a coronary CT angiogram, coronary artery scanning, or CTA. A cardiac CT angiogram allows the health care provider to see how well blood is flowing to and from the heart. The health care provider will be able to see if there are any problems, such as:  Blockage or narrowing of the coronary arteries in the heart.  Fluid around the heart.  Signs of weakness or disease in the muscles, valves, and tissues of the heart. Tell a health care provider about:  Any allergies you have. This is especially important if you have had a previous allergic reaction to contrast dye.  All medicines you are taking, including vitamins, herbs, eye drops, creams, and over-the-counter medicines.  Any blood disorders you have.  Any surgeries you have had.  Any medical conditions you have.  Whether you are pregnant or may be pregnant.  Any anxiety disorders, chronic pain, or other conditions you have  that may increase your stress or prevent you from lying still. What are the risks? Generally, this is a safe procedure. However, problems may occur, including:  Bleeding.  Infection.  Allergic reactions to medicines or dyes.  Damage to other structures or organs.  Kidney damage from the contrast dye that is used.  Increased risk of cancer from radiation exposure. This risk is low. Talk with your health care provider about: ? The risks and benefits of testing. ? How you can receive the lowest dose of radiation. What happens before the procedure?  Wear comfortable clothing and remove any jewelry, glasses, dentures, and hearing aids.  Follow instructions from your health care provider about eating and drinking. This may include: ? For 12 hours before the procedure -- avoid caffeine. This includes tea, coffee, soda, energy drinks, and diet pills. Drink plenty of water or other fluids that do not have caffeine in them. Being well hydrated can prevent complications. ? For 4-6 hours before the procedure -- stop eating and drinking. The contrast dye can cause nausea, but this is less likely if your stomach is empty.  Ask your health care provider about changing  or stopping your regular medicines. This is especially important if you are taking diabetes medicines, blood thinners, or medicines to treat problems with erections (erectile dysfunction). What happens during the procedure?  Hair on your chest may need to be removed so that small sticky patches called electrodes can be placed on your chest. These will transmit information that helps to monitor your heart during the procedure.  An IV will be inserted into one of your veins.  You might be given a medicine to control your heart rate during the procedure. This will help to ensure that good images are obtained.  You will be asked to lie on an exam table. This table will slide in and out of the CT machine during the procedure.  Contrast  dye will be injected into the IV. You might feel warm, or you may get a metallic taste in your mouth.  You will be given a medicine called nitroglycerin. This will relax or dilate the arteries in your heart.  The table that you are lying on will move into the CT machine tunnel for the scan.  The person running the machine will give you instructions while the scans are being done. You may be asked to: ? Keep your arms above your head. ? Hold your breath. ? Stay very still, even if the table is moving.  When the scanning is complete, you will be moved out of the machine.  The IV will be removed. The procedure may vary among health care providers and hospitals.   What can I expect after the procedure? After your procedure, it is common to have:  A metallic taste in your mouth from the contrast dye.  A feeling of warmth.  A headache from the nitroglycerin. Follow these instructions at home:  Take over-the-counter and prescription medicines only as told by your health care provider.  If you are told, drink enough fluid to keep your urine pale yellow. This will help to flush the contrast dye out of your body.  Most people can return to their normal activities right after the procedure. Ask your health care provider what activities are safe for you.  It is up to you to get the results of your procedure. Ask your health care provider, or the department that is doing the procedure, when your results will be ready.  Keep all follow-up visits as told by your health care provider. This is important. Contact a health care provider if:  You have any symptoms of allergy to the contrast dye. These include: ? Shortness of breath. ? Rash or hives. ? A racing heartbeat. Summary  A cardiac CT angiogram is a procedure to look at the heart and the area around the heart. It may be done to help find the cause of chest pains or other symptoms of heart disease.  During this procedure, a large X-ray  machine, called a CT scanner, takes detailed pictures of the heart and the surrounding area after a contrast dye has been injected into blood vessels in the area.  Ask your health care provider about changing or stopping your regular medicines before the procedure. This is especially important if you are taking diabetes medicines, blood thinners, or medicines to treat erectile dysfunction.  If you are told, drink enough fluid to keep your urine pale yellow. This will help to flush the contrast dye out of your body. This information is not intended to replace advice given to you by your health care provider. Make sure you  discuss any questions you have with your health care provider. Document Revised: 08/29/2018 Document Reviewed: 08/29/2018 Elsevier Patient Education  2021 [employer].

## 2020-03-05 LAB — BASIC METABOLIC PANEL
BUN/Creatinine Ratio: 27 (ref 12–28)
BUN: 20 mg/dL (ref 8–27)
CO2: 24 mmol/L (ref 20–29)
Calcium: 9.6 mg/dL (ref 8.7–10.3)
Chloride: 103 mmol/L (ref 96–106)
Creatinine, Ser: 0.74 mg/dL (ref 0.57–1.00)
GFR calc Af Amer: 91 mL/min/{1.73_m2} (ref >59)
GFR calc non Af Amer: 79 mL/min/{1.73_m2} (ref >59)
Glucose: 114 mg/dL — ABNORMAL HIGH (ref 65–99)
Potassium: 4.3 mmol/L (ref 3.5–5.2)
Sodium: 142 mmol/L (ref 134–144)

## 2020-03-11 ENCOUNTER — Telehealth (HOSPITAL_COMMUNITY): Payer: Self-pay | Admitting: Emergency Medicine

## 2020-03-11 NOTE — Telephone Encounter (Signed)
Reaching out to patient to offer assistance regarding upcoming cardiac imaging study; pt verbalizes understanding of appt date/time, parking situation and where to check in, pre-test NPO status and medications ordered, and verified current allergies; name and call back number provided for further questions should they arise Dana Kruse RN Navigator Cardiac Imaging [facility] [phone] office [phone] cell  50mg metoprolol tartrate 2hr PTA Dana Velazquez  

## 2020-03-13 ENCOUNTER — Other Ambulatory Visit: Payer: Self-pay

## 2020-03-13 ENCOUNTER — Ambulatory Visit (HOSPITAL_COMMUNITY)
Admission: RE | Admit: 2020-03-13 | Discharge: 2020-03-13 | Disposition: A | Discharge status: Home / Self Care | Payer: Medicare Other | Source: Ambulatory Visit | Attending: Cardiology | Admitting: Cardiology

## 2020-03-13 DIAGNOSIS — Z006 Encounter for examination for normal comparison and control in clinical research program: Secondary | ICD-10-CM

## 2020-03-13 DIAGNOSIS — I10 Essential (primary) hypertension: Secondary | ICD-10-CM | POA: Diagnosis present

## 2020-03-13 DIAGNOSIS — I209 Angina pectoris, unspecified: Secondary | ICD-10-CM | POA: Insufficient documentation

## 2020-03-13 MED ORDER — NITROGLYCERIN 0.4 MG SL SUBL
0.8000 mg | SUBLINGUAL_TABLET | Freq: Once | SUBLINGUAL | Status: AC | PRN
  Administered 2020-03-13: 0.8 mg via SUBLINGUAL

## 2020-03-13 MED ORDER — IOHEXOL 350 MG/ML SOLN
80.0000 mL | Freq: Once | INTRAVENOUS | Status: AC | PRN
  Administered 2020-03-13: 80 mL via INTRAVENOUS

## 2020-03-13 MED ORDER — NITROGLYCERIN 0.4 MG SL SUBL
SUBLINGUAL_TABLET | SUBLINGUAL | Status: AC
  Filled 2020-03-13: qty 2

## 2020-03-13 NOTE — Progress Notes (Signed)
Patient completed Cardiac CT today. Provided with snack after CT and blood pressure rechecked. PIV removed and site intact. Discharged to waiting room in stable condition. Ambulatory with no signs of acute distress.

## 2020-03-13 NOTE — Research (Signed)
IDENTIFY Informed Consent                  Subject Name: Dana Velazquez. [name]   Subject met inclusion and exclusion criteria.  The informed consent form, study requirements and expectations were reviewed with the subject and questions and concerns were addressed prior to the signing of the consent form.  The subject verbalized understanding of the trial requirements.  The subject agreed to participate in the IDENTIFY trial and signed the informed consent at 08:27AM on 03/13/20.  The informed consent was obtained prior to performance of any protocol-specific procedures for the subject.  A copy of the signed informed consent was given to the subject and a copy was placed in the subject's medical record.   Meade Maw, Naval architect

## 2020-03-16 NOTE — Addendum Note (Signed)
Addended by: Eleonore Chiquito on: 03/16/2020 08:14 AM   Modules accepted: Orders

## 2020-03-17 LAB — HEPATIC FUNCTION PANEL
ALT: 18 IU/L (ref 0–32)
AST: 14 IU/L (ref 0–40)
Albumin: 4.2 g/dL (ref 3.7–4.7)
Alkaline Phosphatase: 51 IU/L (ref 44–121)
Bilirubin Total: 0.7 mg/dL (ref 0.0–1.2)
Bilirubin, Direct: 0.18 mg/dL (ref 0.00–0.40)
Total Protein: 6.1 g/dL (ref 6.0–8.5)

## 2020-03-17 LAB — LIPID PANEL
Chol/HDL Ratio: 3 ratio (ref 0.0–4.4)
Cholesterol, Total: 159 mg/dL (ref 100–199)
HDL: 53 mg/dL (ref >39)
LDL Chol Calc (NIH): 93 mg/dL (ref 0–99)
Triglycerides: 63 mg/dL (ref 0–149)
VLDL Cholesterol Cal: 13 mg/dL (ref 5–40)

## 2020-03-17 LAB — BASIC METABOLIC PANEL
BUN/Creatinine Ratio: 19 (ref 12–28)
BUN: 18 mg/dL (ref 8–27)
CO2: 25 mmol/L (ref 20–29)
Calcium: 9.1 mg/dL (ref 8.7–10.3)
Chloride: 104 mmol/L (ref 96–106)
Creatinine, Ser: 0.93 mg/dL (ref 0.57–1.00)
Glucose: 68 mg/dL (ref 65–99)
Potassium: 4.5 mmol/L (ref 3.5–5.2)
Sodium: 143 mmol/L (ref 134–144)
eGFR: 64 mL/min/{1.73_m2} (ref >59)

## 2020-06-02 ENCOUNTER — Ambulatory Visit (INDEPENDENT_AMBULATORY_CARE_PROVIDER_SITE_OTHER): Payer: Medicare Other | Admitting: Cardiology

## 2020-06-02 ENCOUNTER — Encounter: Payer: Self-pay | Admitting: Cardiology

## 2020-06-02 ENCOUNTER — Other Ambulatory Visit: Payer: Self-pay

## 2020-06-02 VITALS — BP 130/68 | HR 72 | Ht 59.0 in | Wt 161.6 lb

## 2020-06-02 DIAGNOSIS — E782 Mixed hyperlipidemia: Secondary | ICD-10-CM

## 2020-06-02 DIAGNOSIS — E669 Obesity, unspecified: Secondary | ICD-10-CM | POA: Diagnosis not present

## 2020-06-02 DIAGNOSIS — I10 Essential (primary) hypertension: Secondary | ICD-10-CM | POA: Diagnosis not present

## 2020-06-02 DIAGNOSIS — I2510 Atherosclerotic heart disease of native coronary artery without angina pectoris: Secondary | ICD-10-CM

## 2020-06-02 HISTORY — DX: Atherosclerotic heart disease of native coronary artery without angina pectoris: I25.10

## 2020-06-02 NOTE — Progress Notes (Signed)
Cardiology Office Note:    Date:  06/02/2020   ID:  Dana Velazquez, DOB September 20, 1945, MRN [id number]  PCP:  Paulina Fusi, MD  Cardiologist:  Garwin Brothers, MD   Referring MD: Paulina Fusi, MD    ASSESSMENT:    1. Benign essential hypertension   2. Coronary artery disease involving native coronary artery of native heart without angina pectoris   3. Mixed dyslipidemia   4. Obesity (BMI 30.0-34.9)    PLAN:    In order of problems listed above:  1. Coronary artery disease: Secondary prevention stressed with the patient.  Importance of compliance with diet medication stressed and she vocalized understanding. 2. Essential hypertension: Blood pressure stable and diet was emphasized.  She was advised to walk half an hour a day 5 days a week and she promises to do so. 3. Mixed dyslipidemia: Diet was emphasized.  Lipids were reviewed and I told her to be more focused on diet and she promises to do so. 4. Obesity: Weight reduction was stressed diet and exercise emphasized.  Risks of obesity explained. 5. Patient will be seen in follow-up appointment in 6 months or earlier if the patient has any concerns    Medication Adjustments/Labs and Tests Ordered: Current medicines are reviewed at length with the patient today.  Concerns regarding medicines are outlined above.  No orders of the defined types were placed in this encounter.  No orders of the defined types were placed in this encounter.    No chief complaint on file.    History of Present Illness:    Dana Velazquez is a 75 y.o. female.  Patient has past medical history of nonobstructive coronary artery disease, essential hypertension dyslipidemia and obesity.  She denies any problems at this time and takes care of activities of daily living.  No chest pain orthopnea or PND.  She does not exercise by protocol and a regular basis.  At the time of my evaluation, the patient is alert awake oriented and in no  distress.  Past Medical History:  Diagnosis Date  . Acute medial meniscus tear of left knee   . Allergic rhinitis, seasonal   . Angina pectoris (HCC) 02/27/2020  . Arthritis    right big toe  . Benign essential hypertension   . Cervical spine arthritis   . Chest pain   . Chronic cough   . Chronic eczematous otitis externa of left ear 03/20/2017  . Chronic pain in left foot 07/26/2016  . Chronic right-sided low back pain without sciatica   . Constipation   . DDD (degenerative disc disease), lumbar   . Degenerative cervical spinal stenosis    Sever at C6, C7  . Edema of left lower extremity   . GERD (gastroesophageal reflux disease)   . Impacted cerumen of left ear 03/20/2017  . Lichen sclerosus    inside vagina  . Lumbar radiculopathy   . Menopausal flushing   . Mixed dyslipidemia 02/27/2020  . OAB (overactive bladder)   . Obesity (BMI 30.0-34.9) 02/27/2020  . Osteoporosis of multiple sites   . Peroneal tendinitis 12/17/2019  . Primary osteoarthritis of left foot 07/26/2016  . RLS (restless legs syndrome)   . Vitamin D deficiency     Past Surgical History:  Procedure Laterality Date  . ABDOMINAL HYSTERECTOMY  1988  . BUNIONECTOMY Bilateral    Dr. Kaylyn Layer  . CATARACT EXTRACTION, BILATERAL  08/2019  . COLONOSCOPY WITH PROPOFOL N/A 07/03/2012   Procedure: COLONOSCOPY WITH PROPOFOL;  Surgeon: Charolett Bumpers, MD;  Location: [facility];  Service: Endoscopy;  Laterality: N/A;  . EYE SURGERY Left 2014   To open tear duct  . KNEE SURGERY Left 10/10/2014   Dr. Liliane Channel medial meniscectomy with chondroplasty of medial femoral condyle  . LAMINECTOMY  1998   C6-7 by Dr. Deneen Harts  . REPLACEMENT TOTAL KNEE Left 12/13/2016   Dr. Deberah Castle  . SHOULDER SURGERY Left 1998    Current Medications: Current Meds  Medication Sig  . alendronate (FOSAMAX) 70 MG tablet Take 1 tablet by mouth once a week.  Marland Kitchen aspirin EC 81 MG tablet Take 81 mg by mouth daily. Swallow whole.  Marland Kitchen atorvastatin  (LIPITOR) 40 MG tablet Take 40 mg by mouth at bedtime.  . Calcium Carb-Cholecalciferol (CALCIUM-VITAMIN D3) 600-200 MG-UNIT TABS Take 1 tablet by mouth daily.  . Cholecalciferol (VITAMIN D) 2000 UNITS tablet Take 2,000 Units by mouth daily.  . clobetasol cream (TEMOVATE) 0.05 % Apply 1 application topically every other day. For itching  . diclofenac (VOLTAREN) 75 MG EC tablet Take 75 mg by mouth 2 (two) times daily.  Marland Kitchen esomeprazole (NEXIUM) 40 MG capsule Take 1 capsule by mouth daily.  Marland Kitchen lisinopril (ZESTRIL) 20 MG tablet Take 1 tablet by mouth daily.  . meloxicam (MOBIC) 15 MG tablet Take 15 mg by mouth daily.  . vitamin B-12 (CYANOCOBALAMIN) 1000 MCG tablet Take 1,000 mcg by mouth daily.     Allergies:   Penicillins   Social History   Socioeconomic History  . Marital status: Married    Spouse name: Not on file  . Number of children: Not on file  . Years of education: Not on file  . Highest education level: Not on file  Occupational History  . Not on file  Tobacco Use  . Smoking status: Never Smoker  . Smokeless tobacco: Never Used  Substance and Sexual Activity  . Alcohol use: Not Currently  . Drug use: No  . Sexual activity: Not on file  Other Topics Concern  . Not on file  Social History Narrative  . Not on file   Social Determinants of Health   Financial Resource Strain: Not on file  Food Insecurity: Not on file  Transportation Needs: Not on file  Physical Activity: Not on file  Stress: Not on file  Social Connections: Not on file     Family History: The patient's family history includes CAD in her father; COPD in her mother; Heart attack in her father; Stomach cancer in her maternal grandmother.  ROS:   Please see the history of present illness.    All other systems reviewed and are negative.  EKGs/Labs/Other Studies Reviewed:    The following studies were reviewed today: I discussed coronary angiography findings and lipid report with the patient at  length   Recent Labs: 03/17/2020: ALT 18; BUN 18; Creatinine, Ser 0.93; Potassium 4.5; Sodium 143  Recent Lipid Panel    Component Value Date/Time   CHOL 159 03/17/2020 0948   TRIG 63 03/17/2020 0948   HDL 53 03/17/2020 0948   CHOLHDL 3.0 03/17/2020 0948   LDLCALC 93 03/17/2020 0948    Physical Exam:    VS:  BP 130/68   Pulse 72   Ht 4\' 11"  (1.499 m)   Wt 161 lb 9.6 oz (73.3 kg)   SpO2 98%   BMI 32.64 kg/m     Wt Readings from Last 3 Encounters:  06/02/20 161 lb 9.6 oz (73.3 kg)  02/27/20 167 lb (  75.8 kg)  02/19/20 167 lb (75.8 kg)     GEN: Patient is in no acute distress HEENT: Normal NECK: No JVD; No carotid bruits LYMPHATICS: No lymphadenopathy CARDIAC: Hear sounds regular, 2/6 systolic murmur at the apex. RESPIRATORY:  Clear to auscultation without rales, wheezing or rhonchi  ABDOMEN: Soft, non-tender, non-distended MUSCULOSKELETAL:  No edema; No deformity  SKIN: Warm and dry NEUROLOGIC:  Alert and oriented x 3 PSYCHIATRIC:  Normal affect   Signed, Garwin Brothers, MD  06/02/2020 9:13 AM    [facility]

## 2020-06-02 NOTE — Patient Instructions (Signed)

## 2020-07-01 ENCOUNTER — Telehealth: Payer: Self-pay

## 2020-07-01 DIAGNOSIS — Z006 Encounter for examination for normal comparison and control in clinical research program: Secondary | ICD-10-CM

## 2020-07-01 NOTE — Telephone Encounter (Signed)
I called patient for her 90-day Identify Study follow up phone call. Patient is doing well with no cardiac symptoms at this time. I reminded patient I would call her in February for her 1 year follow-up. 

## 2020-10-08 DIAGNOSIS — M7752 Other enthesopathy of left foot: Secondary | ICD-10-CM

## 2020-10-08 DIAGNOSIS — M25572 Pain in left ankle and joints of left foot: Secondary | ICD-10-CM | POA: Insufficient documentation

## 2020-10-08 DIAGNOSIS — M25472 Effusion, left ankle: Secondary | ICD-10-CM

## 2020-10-08 HISTORY — DX: Effusion, left ankle: M25.472

## 2020-10-08 HISTORY — DX: Other enthesopathy of left foot and ankle: M77.52

## 2020-11-19 DIAGNOSIS — H66012 Acute suppurative otitis media with spontaneous rupture of ear drum, left ear: Secondary | ICD-10-CM | POA: Insufficient documentation

## 2020-11-19 DIAGNOSIS — H9212 Otorrhea, left ear: Secondary | ICD-10-CM | POA: Insufficient documentation

## 2020-11-19 HISTORY — DX: Acute suppurative otitis media with spontaneous rupture of ear drum, left ear: H66.012

## 2020-11-19 HISTORY — DX: Otorrhea, left ear: H92.12

## 2020-12-01 DIAGNOSIS — H6982 Other specified disorders of Eustachian tube, left ear: Secondary | ICD-10-CM | POA: Insufficient documentation

## 2020-12-01 DIAGNOSIS — H6992 Unspecified Eustachian tube disorder, left ear: Secondary | ICD-10-CM

## 2020-12-01 DIAGNOSIS — A4902 Methicillin resistant Staphylococcus aureus infection, unspecified site: Secondary | ICD-10-CM

## 2020-12-01 HISTORY — DX: Unspecified Eustachian tube disorder, left ear: H69.92

## 2020-12-01 HISTORY — DX: Other specified disorders of eustachian tube, left ear: H69.82

## 2020-12-01 HISTORY — DX: Methicillin resistant Staphylococcus aureus infection, unspecified site: A49.02

## 2020-12-06 DIAGNOSIS — H90A32 Mixed conductive and sensorineural hearing loss, unilateral, left ear with restricted hearing on the contralateral side: Secondary | ICD-10-CM

## 2020-12-06 HISTORY — DX: Mixed conductive and sensorineural hearing loss, unilateral, left ear with restricted hearing on the contralateral side: H90.A32

## 2020-12-17 HISTORY — PX: OTHER SURGICAL HISTORY: SHX169

## 2021-01-21 ENCOUNTER — Ambulatory Visit: Payer: Medicare Other | Admitting: Cardiology

## 2021-07-09 ENCOUNTER — Ambulatory Visit: Payer: Medicare Other | Admitting: Cardiology

## 2021-08-04 ENCOUNTER — Encounter: Payer: Self-pay | Admitting: Cardiology

## 2021-08-04 ENCOUNTER — Ambulatory Visit (INDEPENDENT_AMBULATORY_CARE_PROVIDER_SITE_OTHER): Payer: Medicare Other | Admitting: Cardiology

## 2021-08-04 VITALS — BP 146/84 | HR 71 | Ht 59.0 in | Wt 159.8 lb

## 2021-08-04 DIAGNOSIS — E782 Mixed hyperlipidemia: Secondary | ICD-10-CM | POA: Diagnosis not present

## 2021-08-04 DIAGNOSIS — I10 Essential (primary) hypertension: Secondary | ICD-10-CM

## 2021-08-04 DIAGNOSIS — I2510 Atherosclerotic heart disease of native coronary artery without angina pectoris: Secondary | ICD-10-CM

## 2021-08-04 DIAGNOSIS — E669 Obesity, unspecified: Secondary | ICD-10-CM

## 2021-08-04 MED ORDER — NITROGLYCERIN 0.4 MG SL SUBL: 0.4000 mg | SUBLINGUAL_TABLET | SUBLINGUAL | 6 refills | Status: DC | PRN

## 2021-08-04 MED ORDER — ASPIRIN 81 MG PO TBEC: 81.0000 mg | DELAYED_RELEASE_TABLET | Freq: Every day | ORAL | 3 refills | Status: DC

## 2021-08-04 NOTE — Patient Instructions (Signed)
Medication Instructions:  Your physician has recommended you make the following change in your medication:   Use nitroglycerin 1 tablet placed under the tongue at the first sign of chest pain or an angina attack. 1 tablet may be used every 5 minutes as needed, for up to 15 minutes. Do not take more than 3 tablets in 15 minutes. If pain persist call 911 or go to the nearest ED.  Start 81 mg coated aspirin daily.  *If you need a refill on your cardiac medications before your next appointment, please call your pharmacy*   Lab Work: Your physician recommends that you return for lab work in: the next few days. You need to have labs done when you are fasting.  You can come Monday through Friday 8:30 am to 12:00 pm and 1:15 to 4:30. You do not need to make an appointment as the order has already been placed. The labs you are going to have done are BMET, LFT and Lipids.  If you have labs (blood work) drawn today and your tests are completely normal, you will receive your results only by: MyChart Message (if you have MyChart) OR A paper copy in the mail If you have any lab test that is abnormal or we need to change your treatment, we will call you to review the results.   Testing/Procedures: None ordered   Follow-Up: At [facility], you and your health needs are our priority.  As part of our continuing mission to provide you with exceptional heart care, we have created designated Provider Care Teams.  These Care Teams include your primary Cardiologist (physician) and Advanced Practice Providers (APPs -  Physician Assistants and Nurse Practitioners) who all work together to provide you with the care you need, when you need it.  We recommend signing up for the patient portal called "MyChart".  Sign up information is provided on this After Visit Summary.  MyChart is used to connect with patients for Virtual Visits (Telemedicine).  Patients are able to view lab/test results, encounter notes,  upcoming appointments, etc.  Non-urgent messages can be sent to your provider as well.   To learn more about what you can do with MyChart, go to [email or web address].    Your next appointment:   9 month(s)  The format for your next appointment:   In Person  Provider:   Belva Crome, MD   Other Instructions NA

## 2021-08-04 NOTE — Progress Notes (Signed)
Cardiology Office Note:    Date:  08/04/2021   ID:  Dana Velazquez, DOB [birth year], MRN [id number]  PCP:  Paulina Fusi, MD  Cardiologist:  Garwin Brothers, MD   Referring MD: Paulina Fusi, MD    ASSESSMENT:    1. Benign essential hypertension   2. Coronary artery disease involving native coronary artery of native heart without angina pectoris   3. Mixed dyslipidemia   4. Obesity (BMI 30.0-34.9)    PLAN:    In order of problems listed above:  Coronary artery disease: CT coronary angiography report detailed below.  Secondary prevention stressed with the patient.  Importance of compliance with diet and medication stressed and she vocalized understanding.  She was told to take a coated baby aspirin at least 3-4 times a week and she vocalized understanding.  She is agreeable.  This is 81 mg aspirin.  She was advised to walk at least half an hour a day 5 days a week and she promises to do so. Essential hypertension: Blood pressure stable and lifestyle modification urged.  She has an element of whitecoat hypertension. Mixed dyslipidemia: Diet was emphasized.  Lifestyle modification urged.  I told her to come back in the next few days for lipid check as her past lipid check was significantly abnormal. Obesity: Weight reduction stressed and diet was emphasized. Patient will be seen in follow-up appointment in 6 months or earlier if the patient has any concerns    Medication Adjustments/Labs and Tests Ordered: Current medicines are reviewed at length with the patient today.  Concerns regarding medicines are outlined above.  No orders of the defined types were placed in this encounter.  No orders of the defined types were placed in this encounter.    No chief complaint on file.    History of Present Illness:    Dana Velazquez is a 76 y.o. female.  Patient has past medical history of coronary artery disease, essential hypertension, mixed dyslipidemia and obesity.  She  denies any problems at this time and takes care of activities of daily living.  No chest pain orthopnea or PND.  Overall she leads a sedentary lifestyle.  At the time of my evaluation, the patient is alert awake oriented and in no distress.  Past Medical History:  Diagnosis Date   Acute medial meniscus tear of left knee    Acute suppurative otitis media of left ear with spontaneous rupture of tympanic membrane 11/19/2020   Allergic rhinitis, seasonal    Angina pectoris (HCC) 02/27/2020   Arthritis    right big toe   Benign essential hypertension    Cervical spine arthritis    Chest pain    Chronic cough    Chronic eczematous otitis externa of left ear 03/20/2017   Chronic pain in left foot 07/26/2016   Chronic right-sided low back pain without sciatica    Constipation    Coronary artery disease 06/02/2020   DDD (degenerative disc disease), lumbar    Degenerative cervical spinal stenosis    Sever at C6, C7   Edema of left lower extremity    ETD (Eustachian tube dysfunction), left 12/01/2020   GERD (gastroesophageal reflux disease)    Impacted cerumen of left ear 03/20/2017   Left ankle tendonitis 10/08/2020   Lichen sclerosus    inside vagina   Lumbar radiculopathy    Menopausal flushing    Mixed conductive and sensorineural hearing loss of left ear with restricted hearing of right ear 12/06/2020  Mixed dyslipidemia 02/27/2020   MRSA (methicillin resistant Staphylococcus aureus) 12/01/2020   OAB (overactive bladder)    Obesity (BMI 30.0-34.9) 02/27/2020   Osteoporosis of multiple sites    Otorrhea of left ear 11/19/2020   Pain and swelling of ankle, left 10/08/2020   Peroneal tendinitis 12/17/2019   Primary osteoarthritis of left foot 07/26/2016   RLS (restless legs syndrome)    Vitamin D deficiency     Past Surgical History:  Procedure Laterality Date   ABDOMINAL HYSTERECTOMY  1988   BUNIONECTOMY Bilateral    Dr. Kaylyn Layer   CATARACT EXTRACTION, BILATERAL  08/2019   COLONOSCOPY  WITH PROPOFOL N/A 07/03/2012   Procedure: COLONOSCOPY WITH PROPOFOL;  Surgeon: Charolett Bumpers, MD;  Location: [facility];  Service: Endoscopy;  Laterality: N/A;   EYE SURGERY Left 2014   To open tear duct   KNEE SURGERY Left 10/10/2014   Dr. Liliane Channel medial meniscectomy with chondroplasty of medial femoral condyle   LAMINECTOMY  1998   C6-7 by Dr. Deneen Harts   REPLACEMENT TOTAL KNEE Left 12/13/2016   Dr. Deberah Castle   SHOULDER SURGERY Left 1998    Current Medications: Current Meds  Medication Sig   alendronate (FOSAMAX) 70 MG tablet Take 1 tablet by mouth once a week.   atorvastatin (LIPITOR) 40 MG tablet Take 40 mg by mouth at bedtime.   Calcium Carb-Cholecalciferol (CALCIUM-VITAMIN D3) 600-200 MG-UNIT TABS Take 1 tablet by mouth daily.   clobetasol cream (TEMOVATE) 0.05 % Apply 1 application topically every other day. For itching   esomeprazole (NEXIUM) 40 MG capsule Take 1 capsule by mouth daily.   ezetimibe (ZETIA) 10 MG tablet Take 10 mg by mouth daily.     Allergies:   Penicillins and Prednisone   Social History   Socioeconomic History   Marital status: Married    Spouse name: Not on file   Number of children: Not on file   Years of education: Not on file   Highest education level: Not on file  Occupational History   Not on file  Tobacco Use   Smoking status: Never   Smokeless tobacco: Never  Substance and Sexual Activity   Alcohol use: Not Currently   Drug use: No   Sexual activity: Not on file  Other Topics Concern   Not on file  Social History Narrative   Not on file   Social Determinants of Health   Financial Resource Strain: Not on file  Food Insecurity: Not on file  Transportation Needs: Not on file  Physical Activity: Not on file  Stress: Not on file  Social Connections: Not on file     Family History: The patient's family history includes CAD in her father; COPD in her mother; Heart attack in her father; Stomach cancer in her maternal  grandmother.  ROS:   Please see the history of present illness.    All other systems reviewed and are negative.  EKGs/Labs/Other Studies Reviewed:    The following studies were reviewed today: Discussed CT coronary angiography with the patient at length.  IMPRESSION: 1. Coronary calcium score of 42.6. This was 45th percentile for age and sex matched control.   2. Normal coronary origin with right dominance.   3.  Mild plaque in the mid LAD.  CAD-RADS 2.   4.  No evidence of obstructive coronary disease.   Chilton Si, MD     Electronically Signed   By: Chilton Si   On: 03/13/2020 15:38 (   Recent Labs: No  results found for requested labs within last 365 days.  Recent Lipid Panel    Component Value Date/Time   CHOL 159 03/17/2020 0948   TRIG 63 03/17/2020 0948   HDL 53 03/17/2020 0948   CHOLHDL 3.0 03/17/2020 0948   LDLCALC 93 03/17/2020 0948    Physical Exam:    VS:  BP (!) 146/84   Pulse 71   Ht 4\' 11"  (1.499 m)   Wt 159 lb 12.8 oz (72.5 kg)   SpO2 95%   BMI 32.28 kg/m     Wt Readings from Last 3 Encounters:  08/04/21 159 lb 12.8 oz (72.5 kg)  06/02/20 161 lb 9.6 oz (73.3 kg)  02/27/20 167 lb (75.8 kg)     GEN: Patient is in no acute distress HEENT: Normal NECK: No JVD; No carotid bruits LYMPHATICS: No lymphadenopathy CARDIAC: Hear sounds regular, 2/6 systolic murmur at the apex. RESPIRATORY:  Clear to auscultation without rales, wheezing or rhonchi  ABDOMEN: Soft, non-tender, non-distended MUSCULOSKELETAL:  No edema; No deformity  SKIN: Warm and dry NEUROLOGIC:  Alert and oriented x 3 PSYCHIATRIC:  Normal affect   Signed, 04/26/20, MD  08/04/2021 1:48 PM    [facility]

## 2021-08-06 LAB — BASIC METABOLIC PANEL
BUN/Creatinine Ratio: 25 (ref 12–28)
BUN: 17 mg/dL (ref 8–27)
CO2: 26 mmol/L (ref 20–29)
Calcium: 9.4 mg/dL (ref 8.7–10.3)
Chloride: 104 mmol/L (ref 96–106)
Creatinine, Ser: 0.68 mg/dL (ref 0.57–1.00)
Glucose: 84 mg/dL (ref 70–99)
Potassium: 4.3 mmol/L (ref 3.5–5.2)
Sodium: 142 mmol/L (ref 134–144)
eGFR: 90 mL/min/{1.73_m2} (ref >59)

## 2021-08-06 LAB — HEPATIC FUNCTION PANEL
ALT: 14 IU/L (ref 0–32)
AST: 16 IU/L (ref 0–40)
Albumin: 4.4 g/dL (ref 3.8–4.8)
Alkaline Phosphatase: 51 IU/L (ref 44–121)
Bilirubin Total: 0.6 mg/dL (ref 0.0–1.2)
Bilirubin, Direct: 0.14 mg/dL (ref 0.00–0.40)
Total Protein: 6.3 g/dL (ref 6.0–8.5)

## 2021-08-06 LAB — LIPID PANEL
Chol/HDL Ratio: 3.2 ratio (ref 0.0–4.4)
Cholesterol, Total: 154 mg/dL (ref 100–199)
HDL: 48 mg/dL (ref >39)
LDL Chol Calc (NIH): 84 mg/dL (ref 0–99)
Triglycerides: 121 mg/dL (ref 0–149)
VLDL Cholesterol Cal: 22 mg/dL (ref 5–40)

## 2021-08-10 ENCOUNTER — Telehealth: Payer: Self-pay | Admitting: Cardiology

## 2021-08-10 NOTE — Telephone Encounter (Signed)
Pt advised her lab results and verbalized understanding.  

## 2021-08-10 NOTE — Telephone Encounter (Signed)
Patient called wanting test results.

## 2021-09-17 ENCOUNTER — Other Ambulatory Visit: Payer: Self-pay | Admitting: Urology

## 2021-09-20 DIAGNOSIS — W19XXXA Unspecified fall, initial encounter: Secondary | ICD-10-CM

## 2021-09-20 HISTORY — DX: Unspecified fall, initial encounter: W19.XXXA

## 2021-09-21 ENCOUNTER — Encounter (HOSPITAL_BASED_OUTPATIENT_CLINIC_OR_DEPARTMENT_OTHER): Payer: Self-pay | Admitting: Urology

## 2021-09-21 ENCOUNTER — Other Ambulatory Visit: Payer: Self-pay

## 2021-09-21 NOTE — Progress Notes (Signed)
Spoke w/ via phone for pre-op interview---pt Lab needs dos---- istat              Lab results------ekg 08-04-2021 chart/epic COVID test -----patient states asymptomatic no test needed Arrive at -------630 am 09-28-2021 NPO after MN NO Solid Food.  Clear liquids from MN until---530 am Med rec completed Medications to take morning of surgery -----nexium, ezetimibe (zetia) Diabetic medication -----n/a Patient instructed no nail polish to be worn day of surgery Patient instructed to bring photo id and insurance card day of surgery Patient aware to have Driver (ride ) / caregiver  husband Fayrene Fearing  for 24 hours after surgery  Patient Special Instructions -----stay on all anticoagulants per dr pace instructions (81 mg aspirin) Pre-Op special Istructions -----none Patient verbalized understanding of instructions that were given at this phone interview. Patient denies cardiac s & s shortness of breath, chest pain, fever, cough at this phone interview. Pt states she has never used nitro.  Lov dr Tomie China cardiology 08-04-2021 epic Ct cornonary 03-13-2020 epic

## 2021-09-26 DIAGNOSIS — L03116 Cellulitis of left lower limb: Secondary | ICD-10-CM

## 2021-09-26 HISTORY — DX: Cellulitis of left lower limb: L03.116

## 2021-09-28 DIAGNOSIS — Z01818 Encounter for other preprocedural examination: Secondary | ICD-10-CM

## 2021-10-14 ENCOUNTER — Encounter (HOSPITAL_BASED_OUTPATIENT_CLINIC_OR_DEPARTMENT_OTHER): Payer: Self-pay | Admitting: Urology

## 2021-10-15 ENCOUNTER — Encounter (HOSPITAL_BASED_OUTPATIENT_CLINIC_OR_DEPARTMENT_OTHER): Payer: Self-pay | Admitting: Urology

## 2021-10-15 ENCOUNTER — Other Ambulatory Visit: Payer: Self-pay

## 2021-10-15 NOTE — Progress Notes (Signed)
Spoke w/ via phone for pre-op interview---pt Lab needs dos----  I stat             Lab results------ COVID test -----patient states asymptomatic no test needed Arrive at -------730 am [phone] NPO after MN NO Solid Food.  Clear liquids from MN until---630 am Med rec completed Medications to take morning of surgery -----nexium, zetia Diabetic medication -----n/a Patient instructed no nail polish to be worn day of surgery Patient instructed to bring photo id and insurance card day of surgery Patient aware to have Driver (ride ) / caregiver   Merchandiser, retail husband  for 24 hours after surgery  Patient Special Instructions -----stay on all anticoagulants per dr pace instructions ( 81 mg asa) Pre-Op special Istructions -----none Patient verbalized understanding of instructions that were given at this phone interview. Patient denies shortness of breath, chest pain, fever, cough at this phone interview. Patient states she has never used nitro.  Lov dr Geraldo Pitter 08-04-2021 epic Ct coronary 03-13-2020 epic

## 2021-10-21 NOTE — H&P (Addendum)
CC/HPI: cc: Urinary incontinence and pelvic organ prolapse   08/16/2021: 76 year old woman with history of worsening urinary urgency and urge incontinence as well as pelvic organ prolapse referred for evaluation. Patient reports trying 3 different medications for overactive bladder however cannot remember which ones they were. Patient frequently has accidents at home. She does not get frequent UTIs and he denies any gross hematuria. She has very occasional stress urinary incontinence. She has nocturia 2-3 times a night. She is wearing pads daily.   09/15/2021: 76 year old woman with a history of worsening urinary urgency and urge incontinence here for follow-up after urodynamic study. Patient was noted to have overactive bladder on urinary study. She had no stress urinary incontinence with or without prolapse reduced.   Overactive bladder validated question screener: 3, 0, 4, 4, 4, 4, 4=27/40 UDS SUMMARY  Ms. Lippard held a max capacity of approx. 304 mls. Her 1st sensation was felt at 153 mls. No positive SUI noted with and without prolapse reduction. There was positive instability. A few unstable contractions noted. Some of these contractions were low amplitude. She was able to generate a voluntary contraction and void 262 mls with max flow of 17 ml/s. EMG leads were basically quiet during voiding. PVR was approx. 0 mls per PVR imaging. Some urine fell on the floor. Trabeculation was noted. No reflux was noted.     ALLERGIES: Penicillin Sulfa    MEDICATIONS: Aspirin  Gemtesa 75 mg tablet 1 tablet PO Daily  Alendronate Sodium 70 mg tablet  Atorvastatin Calcium 40 mg tablet  Clobetasol Propionate 0.05 % ointment  Esomeprazole Magnesium 40 mg capsule,delayed release  Ezetimibe 10 mg tablet  Ibuprofen 600 mg tablet  Vitamin B12  Vitamin D3     GU PSH: Complex cystometrogram, w/ void pressure and urethral pressure profile studies, any technique - 09/01/2021 Complex Uroflow - 09/01/2021 Emg  surf Electrd - 09/01/2021 Inject For cystogram - 09/01/2021 Intrabd voidng Press - 09/01/2021     NON-GU PSH: Cataract surgery Knee replacement Neck Surgery (Unspecified)     GU PMH: Urge incontinence - 09/01/2021, - 08/16/2021 Female genital prolapse, unspecified - 08/16/2021 Urinary Frequency - 08/16/2021 Urinary Urgency - 08/16/2021    NON-GU PMH: Arthritis Hypercholesterolemia Sleep Apnea    FAMILY HISTORY: 1 Daughter - Runs in Family 1 son - Runs in Family   SOCIAL HISTORY: Marital Status: Married Preferred Language: English; Ethnicity: Not Hispanic Or Latino; Race: White Current Smoking Status: Patient has never smoked.   Tobacco Use Assessment Completed: Used Tobacco in last 30 days? Has never drank.  Does not drink caffeine.    REVIEW OF SYSTEMS:    GU Review Female:   Patient reports get up at night to urinate, burning /pain with urination, frequent urination, and stream starts and stops. Patient denies hard to postpone urination, trouble starting your stream, have to strain to urinate, leakage of urine, and being pregnant.  Gastrointestinal (Upper):   Patient denies nausea, vomiting, and indigestion/ heartburn.  Gastrointestinal (Lower):   Patient denies diarrhea and constipation.  Constitutional:   Patient denies fever, night sweats, weight loss, and fatigue.  Skin:   Patient denies skin rash/ lesion and itching.  Eyes:   Patient denies blurred vision and double vision.  Ears/ Nose/ Throat:   Patient denies sore throat and sinus problems.  Hematologic/Lymphatic:   Patient denies swollen glands and easy bruising.  Cardiovascular:   Patient denies leg swelling and chest pains.  Respiratory:   Patient denies cough and shortness  of breath.  Endocrine:   Patient denies excessive thirst.  Musculoskeletal:   Patient denies back pain and joint pain.  Neurological:   Patient denies headaches and dizziness.  Psychologic:   Patient denies depression and anxiety.   VITAL  SIGNS: None   MULTI-SYSTEM PHYSICAL EXAMINATION:    Constitutional: Well-nourished. No physical deformities. Normally developed. Good grooming.  Neck: Neck symmetrical, not swollen. Normal tracheal position.  Respiratory: No labored breathing, no use of accessory muscles.   Skin: No paleness, no jaundice, no cyanosis. No lesion, no ulcer, no rash.  Neurologic / Psychiatric: Oriented to time, oriented to place, oriented to person. No depression, no anxiety, no agitation.  Eyes: Normal conjunctivae. Normal eyelids.  Ears, Nose, Mouth, and Throat: Left ear no scars, no lesions, no masses. Right ear no scars, no lesions, no masses. Nose no scars, no lesions, no masses. Normal hearing. Normal lips.  Musculoskeletal: Normal gait and station of head and neck.     Complexity of Data:  Records Review:   Previous Patient Records, POC Tool  Urodynamics Review:   Review Urodynamics Tests   PROCEDURES: None   ASSESSMENT:      ICD-10 Details  1 GU:   Urge incontinence - N39.41 Chronic, Stable  2   Urinary Frequency - R35.0 Chronic, Stable  3   Urinary Urgency - R39.15 Chronic, Stable   PLAN:           Document Letter(s):  Created for Patient: Clinical Summary         Notes:   Overactive bladder: I discussed urodynamics results with patient and her husband and we discussed management options including peripheral tibial nerve stimulation, intravesical Botox injection, and InterStim. Risks and benefits of Botox injection were discussed with the patient and her husband including but not limited to urinary retention, hematuria, and infection. I discussed that patient might not be completely dry but most patients do have a reduction in episodes of incontinence. Patient is interested in proceeding with this under MAC. She will be scheduled for next billable date.

## 2021-10-25 NOTE — Progress Notes (Signed)
Pt called today and stated she was just diagnosed with yeast infection under her arm today by her pcp.  Pt verbalized understanding this will be up to her surgeon so needs to call ask her surgeon.

## 2021-10-26 ENCOUNTER — Ambulatory Visit (HOSPITAL_BASED_OUTPATIENT_CLINIC_OR_DEPARTMENT_OTHER): Payer: Medicare Other | Admitting: Anesthesiology

## 2021-10-26 ENCOUNTER — Encounter (HOSPITAL_BASED_OUTPATIENT_CLINIC_OR_DEPARTMENT_OTHER)
Admission: RE | Disposition: A | Discharge status: Home / Self Care | Payer: Self-pay | Source: Home / Self Care | Attending: Urology

## 2021-10-26 ENCOUNTER — Ambulatory Visit (HOSPITAL_BASED_OUTPATIENT_CLINIC_OR_DEPARTMENT_OTHER)
Admission: RE | Admit: 2021-10-26 | Discharge: 2021-10-26 | Disposition: A | Discharge status: Home / Self Care | Payer: Medicare Other | Attending: Urology | Admitting: Urology

## 2021-10-26 ENCOUNTER — Other Ambulatory Visit: Payer: Self-pay

## 2021-10-26 ENCOUNTER — Encounter (HOSPITAL_BASED_OUTPATIENT_CLINIC_OR_DEPARTMENT_OTHER): Payer: Self-pay | Admitting: Urology

## 2021-10-26 DIAGNOSIS — I10 Essential (primary) hypertension: Secondary | ICD-10-CM | POA: Insufficient documentation

## 2021-10-26 DIAGNOSIS — N3281 Overactive bladder: Secondary | ICD-10-CM | POA: Diagnosis present

## 2021-10-26 DIAGNOSIS — Z6832 Body mass index (BMI) 32.0-32.9, adult: Secondary | ICD-10-CM | POA: Diagnosis not present

## 2021-10-26 DIAGNOSIS — I2510 Atherosclerotic heart disease of native coronary artery without angina pectoris: Secondary | ICD-10-CM

## 2021-10-26 DIAGNOSIS — M1990 Unspecified osteoarthritis, unspecified site: Secondary | ICD-10-CM | POA: Diagnosis not present

## 2021-10-26 DIAGNOSIS — N3946 Mixed incontinence: Secondary | ICD-10-CM | POA: Diagnosis not present

## 2021-10-26 DIAGNOSIS — K219 Gastro-esophageal reflux disease without esophagitis: Secondary | ICD-10-CM | POA: Diagnosis not present

## 2021-10-26 DIAGNOSIS — E669 Obesity, unspecified: Secondary | ICD-10-CM | POA: Diagnosis not present

## 2021-10-26 DIAGNOSIS — G4730 Sleep apnea, unspecified: Secondary | ICD-10-CM | POA: Diagnosis not present

## 2021-10-26 DIAGNOSIS — Z79899 Other long term (current) drug therapy: Secondary | ICD-10-CM | POA: Insufficient documentation

## 2021-10-26 DIAGNOSIS — Z01818 Encounter for other preprocedural examination: Secondary | ICD-10-CM

## 2021-10-26 DIAGNOSIS — N3941 Urge incontinence: Secondary | ICD-10-CM

## 2021-10-26 HISTORY — DX: Presence of spectacles and contact lenses: Z97.3

## 2021-10-26 HISTORY — PX: BOTOX INJECTION: SHX5754

## 2021-10-26 HISTORY — DX: Urinary tract infection, site not specified: N39.0

## 2021-10-26 HISTORY — PX: CYSTOSCOPY: SHX5120

## 2021-10-26 HISTORY — DX: Disorder of the skin and subcutaneous tissue, unspecified: L98.9

## 2021-10-26 HISTORY — DX: Sleep apnea, unspecified: G47.30

## 2021-10-26 LAB — POCT I-STAT, CHEM 8
BUN: 17 mg/dL (ref 8–23)
Calcium, Ion: 1.28 mmol/L (ref 1.15–1.40)
Chloride: 105 mmol/L (ref 98–111)
Creatinine, Ser: 0.7 mg/dL (ref 0.44–1.00)
Glucose, Bld: 91 mg/dL (ref 70–99)
HCT: 43 % (ref 36.0–46.0)
Hemoglobin: 14.6 g/dL (ref 12.0–15.0)
Potassium: 3.9 mmol/L (ref 3.5–5.1)
Sodium: 144 mmol/L (ref 135–145)
TCO2: 27 mmol/L (ref 22–32)

## 2021-10-26 SURGERY — CYSTOSCOPY
Anesthesia: Monitor Anesthesia Care | Site: Bladder

## 2021-10-26 MED ORDER — SODIUM CHLORIDE (PF) 0.9 % IJ SOLN
INTRAMUSCULAR | Status: DC | PRN
  Administered 2021-10-26: 20 mL via INTRAVENOUS

## 2021-10-26 MED ORDER — PROPOFOL 10 MG/ML IV BOLUS
INTRAVENOUS | Status: DC | PRN
  Administered 2021-10-26: 30 mg via INTRAVENOUS
  Administered 2021-10-26: 20 mg via INTRAVENOUS

## 2021-10-26 MED ORDER — PROPOFOL 500 MG/50ML IV EMUL
INTRAVENOUS | Status: AC
  Filled 2021-10-26: qty 50

## 2021-10-26 MED ORDER — LACTATED RINGERS IV SOLN: INTRAVENOUS | Status: DC

## 2021-10-26 MED ORDER — LIDOCAINE HCL (PF) 2 % IJ SOLN
INTRAMUSCULAR | Status: AC
  Filled 2021-10-26: qty 5

## 2021-10-26 MED ORDER — WATER FOR IRRIGATION, STERILE IR SOLN
Status: DC | PRN
  Administered 2021-10-26: 1000 mL via INTRAVESICAL

## 2021-10-26 MED ORDER — PROPOFOL 500 MG/50ML IV EMUL
INTRAVENOUS | Status: DC | PRN
  Administered 2021-10-26: 200 ug/kg/min via INTRAVENOUS

## 2021-10-26 MED ORDER — ONDANSETRON HCL 4 MG/2ML IJ SOLN
INTRAMUSCULAR | Status: AC
  Filled 2021-10-26: qty 2

## 2021-10-26 MED ORDER — ONABOTULINUMTOXINA 100 UNITS IJ SOLR
INTRAMUSCULAR | Status: DC | PRN
  Administered 2021-10-26: 100 [IU] via INTRAMUSCULAR

## 2021-10-26 MED ORDER — OXYCODONE HCL 5 MG/5ML PO SOLN: 5.0000 mg | Freq: Once | ORAL | Status: DC | PRN

## 2021-10-26 MED ORDER — PROPOFOL 10 MG/ML IV BOLUS
INTRAVENOUS | Status: AC
  Filled 2021-10-26: qty 20

## 2021-10-26 MED ORDER — CEFAZOLIN SODIUM-DEXTROSE 2-4 GM/100ML-% IV SOLN
2.0000 g | INTRAVENOUS | Status: AC
  Administered 2021-10-26: 2 g via INTRAVENOUS

## 2021-10-26 MED ORDER — FENTANYL CITRATE (PF) 100 MCG/2ML IJ SOLN
INTRAMUSCULAR | Status: DC | PRN
  Administered 2021-10-26: 50 ug via INTRAVENOUS
  Administered 2021-10-26: 25 ug via INTRAVENOUS

## 2021-10-26 MED ORDER — OXYCODONE HCL 5 MG PO TABS: 5.0000 mg | ORAL_TABLET | Freq: Once | ORAL | Status: DC | PRN

## 2021-10-26 MED ORDER — ONDANSETRON HCL 4 MG/2ML IJ SOLN
INTRAMUSCULAR | Status: DC | PRN
  Administered 2021-10-26: 4 mg via INTRAVENOUS

## 2021-10-26 MED ORDER — HYDROMORPHONE HCL 1 MG/ML IJ SOLN: 0.2500 mg | INTRAMUSCULAR | Status: DC | PRN

## 2021-10-26 MED ORDER — FENTANYL CITRATE (PF) 100 MCG/2ML IJ SOLN
INTRAMUSCULAR | Status: AC
  Filled 2021-10-26: qty 2

## 2021-10-26 MED ORDER — CEFAZOLIN SODIUM-DEXTROSE 2-4 GM/100ML-% IV SOLN
INTRAVENOUS | Status: AC
  Filled 2021-10-26: qty 100

## 2021-10-26 MED ORDER — DEXAMETHASONE SODIUM PHOSPHATE 10 MG/ML IJ SOLN
INTRAMUSCULAR | Status: AC
  Filled 2021-10-26: qty 1

## 2021-10-26 MED ORDER — PROMETHAZINE HCL 25 MG/ML IJ SOLN: 6.2500 mg | INTRAMUSCULAR | Status: DC | PRN

## 2021-10-26 SURGICAL SUPPLY — 18 items
BAG DRAIN URO-CYSTO SKYTR STRL (DRAIN) ×1 IMPLANT
BAG DRN UROCATH (DRAIN) ×1
CLOTH BEACON ORANGE TIMEOUT ST (SAFETY) ×1 IMPLANT
ELECT REM PT RETURN 9FT ADLT (ELECTROSURGICAL)
ELECTRODE REM PT RTRN 9FT ADLT (ELECTROSURGICAL) ×1 IMPLANT
GLOVE BIO SURGEON STRL SZ 6.5 (GLOVE) ×1 IMPLANT
GOWN STRL REUS W/TWL LRG LVL3 (GOWN DISPOSABLE) ×1 IMPLANT
KIT TURNOVER CYSTO (KITS) ×1 IMPLANT
MANIFOLD NEPTUNE II (INSTRUMENTS) ×1 IMPLANT
NDL ASPIRATION 22 (NEEDLE) ×1 IMPLANT
NDL SAFETY ECLIP 18X1.5 (MISCELLANEOUS) ×1 IMPLANT
NEEDLE ASPIRATION 22 (NEEDLE) ×1 IMPLANT
PACK CYSTO (CUSTOM PROCEDURE TRAY) ×1 IMPLANT
SYR 20ML LL LF (SYRINGE) ×1 IMPLANT
SYR CONTROL 10ML LL (SYRINGE) ×1 IMPLANT
TUBE CONNECTING 12X1/4 (SUCTIONS) ×1 IMPLANT
TUBING UROLOGY SET (TUBING) ×1 IMPLANT
WATER STERILE IRR 3000ML UROMA (IV SOLUTION) ×1 IMPLANT

## 2021-10-26 NOTE — Op Note (Signed)
Operative Note  Preoperative diagnosis:  1.  Overactive bladder 2.  Urinary urge incontinence  Postoperative diagnosis: 1.  Overactive bladder 2.  Urinary urge incontinence  Procedure(s): 1.  Cytoscopy with injection of 100unit of botox  Surgeon: Jacalyn Lefevre, MD  Assistants:  None  Anesthesia:  MAC  Complications:  None  EBL:  none  Specimens: 1. none  Drains/Catheters: 1.  none  Intraoperative findings:   Normal urethra Grade 3 anterior/apical POP Bilateral orthotropic Uos Normal bladder mucosa without masses, lesions, or stones.  Mild trabeculation with small diverticulum.  Indication:  Dana Velazquez is a 76 y.o. female with with urinary urgency and urge incontinence not responsive to beta 3 agonists and urodynamic study showing positive instability.  Description of procedure: After risks and benefits of the procedure discussed with the patient in detail, informed consent was obtained.  The patient was taken to the operating room placed in supine position.  Anesthesia was induced and antibiotics were administered.  The patient was then repositioned in the dorsolithotomy position.  She was prepped and draped in usual sterile fashion.  A timeout was performed.  The injection cystoscope was then placed in the urethral meatus and advanced in the bladder and direct visualization.  Patient's known prolapse was seen at the start of the case.  100 units of Botox mixed with 20 cc of sterile injectable saline were then injected in the back wall of the bladder in standard template.  Care was taken to avoid the ureteral orifices.  The patient's bladder was decompressed and the cystoscope was removed.  The patient emerged from anesthesia and sent to the PACU in stable condition.   Plan:  Follow up in 2 weeks with PVR

## 2021-10-26 NOTE — Transfer of Care (Signed)
Immediate Anesthesia Transfer of Care Note  Patient: Dana Velazquez  Procedure(s) Performed: CYSTOSCOPY (Bladder) BOTOX INJECTION 100 UNITS (Bladder)  Patient Location: PACU  Anesthesia Type:MAC  Level of Consciousness: awake, alert , oriented and patient cooperative  Airway & Oxygen Therapy: Patient Spontanous Breathing  Post-op Assessment: Report given to RN and Post -op Vital signs reviewed and stable  Post vital signs: Reviewed and stable  Last Vitals:  Vitals Value Taken Time  BP 130/55 10/26/21 1024  Temp    Pulse 72 10/26/21 1025  Resp 20 10/26/21 1025  SpO2 97 % 10/26/21 1025  Vitals shown include unvalidated device data.  Last Pain:  Vitals:   10/26/21 0817  TempSrc: Oral  PainSc: 0-No pain      Patients Stated Pain Goal: 5 (01/08/40 1464)  Complications: No notable events documented.

## 2021-10-26 NOTE — Anesthesia Postprocedure Evaluation (Signed)
Anesthesia Post Note  Patient: ANIKA SHORE  Procedure(s) Performed: CYSTOSCOPY (Bladder) BOTOX INJECTION 100 UNITS (Bladder)     Patient location during evaluation: PACU Anesthesia Type: MAC Level of consciousness: awake and alert Pain management: pain level controlled Vital Signs Assessment: post-procedure vital signs reviewed and stable Respiratory status: spontaneous breathing, nonlabored ventilation and respiratory function stable Cardiovascular status: blood pressure returned to baseline and stable Postop Assessment: no apparent nausea or vomiting Anesthetic complications: no   No notable events documented.  Last Vitals:  Vitals:   10/26/21 1100 10/26/21 1120  BP: (!) 149/60 (!) 149/76  Pulse: (!) 57 (!) 58  Resp: 13 14  Temp: 36.4 C 36.5 C  SpO2: 100% 97%    Last Pain:  Vitals:   10/26/21 1120  TempSrc:   PainSc: 0-No pain                 Lynda Rainwater

## 2021-10-26 NOTE — Discharge Instructions (Addendum)
Cystoscopy with Botox injection patient instructions  Following a cystoscopy, a catheter (a flexible rubber tube) is sometimes left in place to empty the bladder. This may cause some discomfort or a feeling that you need to urinate. Your doctor determines the period of time that the catheter will be left in place. You may have bloody urine for two to three days (Call your doctor if the amount of bleeding increases or does not subside).  You may pass blood clots in your urine, especially if you had a biopsy. It is not unusual to pass small blood clots and have some bloody urine a couple of weeks after your cystoscopy. Again, call your doctor if the bleeding does not subside. You may have: Dysuria (painful urination) Frequency (urinating often) Urgency (strong desire to urinate)  These symptoms are common especially if medicine is instilled into the bladder or a ureteral stent is placed. Avoiding alcohol and caffeine, such as coffee, tea, and chocolate, may help relieve these symptoms. Drink plenty of water, unless otherwise instructed. Your doctor may also prescribe an antibiotic or other medicine to reduce these symptoms.  Cystoscopy results are available soon after the procedure; biopsy results usually take two to four days. Your doctor will discuss the results of your exam with you. Before you go home, you will be given specific instructions for follow-up care. Special Instructions:   If you are going home with a catheter in place do not take a tub bath until removed by your doctor.   You may resume your normal activities.   Do not drive or operate machinery if you are taking narcotic pain medicine.   Be sure to keep all follow-up appointments with your doctor.   Call Your Doctor If: The catheter is not draining You have severe pain You are unable to urinate You have a fever over 101 You have severe bleeding          Post Anesthesia Home Care Instructions  Activity: Get plenty of  rest for the remainder of the day. A responsible individual must stay with you for 24 hours following the procedure.  For the next 24 hours, DO NOT: -Drive a car -Paediatric nurse -Drink alcoholic beverages -Take any medication unless instructed by your physician -Make any legal decisions or sign important papers.  Meals: Start with liquid foods such as gelatin or soup. Progress to regular foods as tolerated. Avoid greasy, spicy, heavy foods. If nausea and/or vomiting occur, drink only clear liquids until the nausea and/or vomiting subsides. Call your physician if vomiting continues.  Special Instructions/Symptoms: Your throat may feel dry or sore from the anesthesia or the breathing tube placed in your throat during surgery. If this causes discomfort, gargle with warm salt water. The discomfort should disappear within 24 hours.

## 2021-10-26 NOTE — Interval H&P Note (Signed)
History and Physical Interval Note:  10/26/2021 8:53 AM  Dana Velazquez  has presented today for surgery, with the diagnosis of OVERACTIVE BLADDER.  The various methods of treatment have been discussed with the patient and family. After consideration of risks, benefits and other options for treatment, the patient has consented to  Procedure(s) with comments: CYSTOSCOPY (N/A) - 30 MNS BOTOX INJECTION 100 UNITS (N/A) as a surgical intervention.  The patient's history has been reviewed, patient examined, no change in status, stable for surgery.  I have reviewed the patient's chart and labs.  Questions were answered to the patient's satisfaction.     Bisma Klett D Railey Glad

## 2021-10-26 NOTE — Anesthesia Preprocedure Evaluation (Signed)
Anesthesia Evaluation  Patient identified by MRN, date of birth, ID band Patient awake    Reviewed: Allergy & Precautions, H&P , NPO status , Patient's Chart, lab work & pertinent test results  Airway Mallampati: III  TM Distance: >3 FB Neck ROM: Full    Dental  (+) Teeth Intact, Dental Advisory Given   Pulmonary sleep apnea ,    breath sounds clear to auscultation       Cardiovascular hypertension, Pt. on medications + CAD   Rhythm:Regular Rate:Normal     Neuro/Psych negative neurological ROS  negative psych ROS   GI/Hepatic Neg liver ROS, GERD  Medicated,  Endo/Other  negative endocrine ROS  Renal/GU negative Renal ROS     Musculoskeletal  (+) Arthritis , Osteoarthritis,    Abdominal (+) + obese,   Peds  Hematology negative hematology ROS (+)   Anesthesia Other Findings   Reproductive/Obstetrics negative OB ROS                             Anesthesia Physical  Anesthesia Plan  ASA: 3  Anesthesia Plan: MAC   Post-op Pain Management: Minimal or no pain anticipated   Induction: Intravenous  PONV Risk Score and Plan: 2 and Ondansetron, Midazolam and Treatment may vary due to age or medical condition  Airway Management Planned: Simple Face Mask  Additional Equipment:   Intra-op Plan:   Post-operative Plan:   Informed Consent: I have reviewed the patients History and Physical, chart, labs and discussed the procedure including the risks, benefits and alternatives for the proposed anesthesia with the patient or authorized representative who has indicated his/her understanding and acceptance.     Dental advisory given  Plan Discussed with: CRNA  Anesthesia Plan Comments:         Anesthesia Quick Evaluation

## 2021-10-27 ENCOUNTER — Encounter (HOSPITAL_BASED_OUTPATIENT_CLINIC_OR_DEPARTMENT_OTHER): Payer: Self-pay | Admitting: Urology

## 2022-01-06 ENCOUNTER — Telehealth: Payer: Self-pay | Admitting: Cardiology

## 2022-01-06 NOTE — Telephone Encounter (Signed)
Needs copy of patient's last EKG fax [phone]. Please advise

## 2022-01-06 NOTE — Telephone Encounter (Signed)
EKG has been faxed.

## 2022-01-25 HISTORY — PX: OTHER SURGICAL HISTORY: SHX169

## 2022-07-01 ENCOUNTER — Other Ambulatory Visit: Payer: Self-pay

## 2022-07-01 DIAGNOSIS — N390 Urinary tract infection, site not specified: Secondary | ICD-10-CM | POA: Insufficient documentation

## 2022-07-01 DIAGNOSIS — Z973 Presence of spectacles and contact lenses: Secondary | ICD-10-CM | POA: Insufficient documentation

## 2022-07-01 DIAGNOSIS — L989 Disorder of the skin and subcutaneous tissue, unspecified: Secondary | ICD-10-CM | POA: Insufficient documentation

## 2022-07-01 DIAGNOSIS — G4730 Sleep apnea, unspecified: Secondary | ICD-10-CM | POA: Insufficient documentation

## 2022-07-01 HISTORY — DX: Urinary tract infection, site not specified: N39.0

## 2022-07-18 ENCOUNTER — Ambulatory Visit: Payer: Medicare Other | Attending: Cardiology | Admitting: Cardiology

## 2022-07-18 ENCOUNTER — Encounter: Payer: Self-pay | Admitting: Cardiology

## 2022-07-18 VITALS — BP 140/86 | HR 66 | Ht 59.0 in | Wt 157.0 lb

## 2022-07-18 DIAGNOSIS — I2510 Atherosclerotic heart disease of native coronary artery without angina pectoris: Secondary | ICD-10-CM

## 2022-07-18 DIAGNOSIS — G4730 Sleep apnea, unspecified: Secondary | ICD-10-CM

## 2022-07-18 DIAGNOSIS — I10 Essential (primary) hypertension: Secondary | ICD-10-CM

## 2022-07-18 DIAGNOSIS — E782 Mixed hyperlipidemia: Secondary | ICD-10-CM

## 2022-07-18 DIAGNOSIS — E669 Obesity, unspecified: Secondary | ICD-10-CM | POA: Diagnosis not present

## 2022-07-18 NOTE — Progress Notes (Signed)
Cardiology Office Note:    Date:  07/18/2022   ID:  Dana Velazquez, DOB [birth year], MRN [id number]  PCP:  Paulina Fusi, MD  Cardiologist:  Garwin Brothers, MD   Referring MD: Paulina Fusi, MD    ASSESSMENT:    1. Benign essential hypertension   2. Coronary artery disease involving native coronary artery of native heart without angina pectoris   3. Sleep apnea, unspecified type   4. Obesity (BMI 30.0-34.9)   5. Mixed dyslipidemia    PLAN:    In order of problems listed above:  Coronary artery disease: Secondary prevention stressed with the patient.  Importance of compliance diet medication stressed and she vocalized understanding.  She was advised to exercise to the best of her ability. Essential hypertension: Blood pressure stable and diet was emphasized.  Lifestyle modification urged. Mixed dyslipidemia: As mentioned below.  Diet was emphasized.  Lipids followed by primary care.  Goal LDL less than 70.  She might need medication such as Nexletol or PCSK9 to lower her lipids to goal.  I discussed this with her and we will follow-up on the blood work from primary care. Obesity: Weight reduction stressed.  Diet emphasized.  Risks of obesity explained and she promises to do better. Patient will be seen in follow-up appointment in 12 months or earlier if the patient has any concerns.    Medication Adjustments/Labs and Tests Ordered: Current medicines are reviewed at length with the patient today.  Concerns regarding medicines are outlined above.  Orders Placed This Encounter  Procedures   EKG 12-Lead   No orders of the defined types were placed in this encounter.    Chief Complaint  Patient presents with   Annual Exam     History of Present Illness:    Dana Velazquez is a 77 y.o. female.  Patient has past medical history of coronary artery disease, essential hypertension, dyslipidemia and obesity.  She denies any problems at this time and takes care of  activities of daily living.  She leads a sedentary lifestyle.  She had blood work done recently and cholesterol was greater than 220.  Her primary care has titrated her lipid-lowering medications and she is due for blood work in the next few days.  At the time of my evaluation, the patient is alert awake oriented and in no distress.  Past Medical History:  Diagnosis Date   Acute medial meniscus tear of left knee    Acute suppurative otitis media of left ear with spontaneous rupture of tympanic membrane 11/19/2020   Allergic rhinitis, seasonal    Angina pectoris (HCC) 02/27/2020   Arthritis    right big toe, little finger right hand, right wrist   Cervical spine arthritis    Chest pain    Chronic cough    Chronic eczematous otitis externa of left ear 03/20/2017   Chronic pain in left foot 07/26/2016   Chronic right-sided low back pain without sciatica    Constipation    Coronary artery disease 06/02/2020   DDD (degenerative disc disease), lumbar    Degenerative cervical spinal stenosis    Severe at C6, C7   Edema of left lower extremity    ETD (Eustachian tube dysfunction), left 12/01/2020   Fall 09/20/2021   GERD (gastroesophageal reflux disease)    history of  Benign essential hypertension    per pt 10-15-2021 no bp meds x 1 year   Impacted cerumen of left ear 03/20/2017   Left ankle  tendonitis 10/08/2020   Left leg cellulitis 09/26/2021   resolved   Lichen sclerosus    inside vagina   Lumbar radiculopathy    Menopausal flushing    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   Mixed dyslipidemia 02/27/2020   MRSA (methicillin resistant Staphylococcus aureus) 12/01/2020   OAB (overactive bladder)    Obesity (BMI 30.0-34.9) 02/27/2020   Osteoporosis of multiple sites    Otorrhea of left ear 11/19/2020   Pain and swelling of ankle, left 10/08/2020   Peroneal tendinitis 12/17/2019   Primary osteoarthritis of left foot 07/26/2016   RLS (restless legs  syndrome)    Skin abnormality    13 stitches in left leg after fall 09-20-2021   Sleep apnea    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   UTI (urinary tract infection)    started  ciprofloxacin 10-11-2021   Vitamin D deficiency    Wears glasses     Past Surgical History:  Procedure Laterality Date   ABDOMINAL HYSTERECTOMY  1988   partial   BOTOX INJECTION N/A 10/26/2021   Procedure: BOTOX INJECTION 100 UNITS;  Surgeon: Noel Christmas, MD;  Location: [facility];  Service: Urology;  Laterality: N/A;   BUNIONECTOMY Bilateral    yrs ago   C 5 to C 7 neck surgery  2022   with plates and screws   CATARACT EXTRACTION, BILATERAL  08/2019   CERVICAL SPINE SURGERY  1998   C 6 to C 7 dr Shea Evans   COLONOSCOPY WITH PROPOFOL N/A 07/03/2012   Procedure: COLONOSCOPY WITH PROPOFOL;  Surgeon: Charolett Bumpers, MD;  Location: [facility];  Service: Endoscopy;  Laterality: N/A;   CYSTOSCOPY N/A 10/26/2021   Procedure: CYSTOSCOPY;  Surgeon: Noel Christmas, MD;  Location: [facility];  Service: Urology;  Laterality: N/A;  30 MNS   EYE SURGERY Left 2014   To open tear duct   KNEE SURGERY Left 10/10/2014   Dr. Liliane Channel medial meniscectomy with chondroplasty of medial femoral condyle   left ear tube placement  12/2020   REPLACEMENT TOTAL KNEE Left 12/13/2016   Dr. Deberah Castle    Current Medications: Current Meds  Medication Sig   alendronate (FOSAMAX) 70 MG tablet Take 1 tablet by mouth once a week.   aspirin EC 81 MG tablet Take 81 mg by mouth daily. Swallow whole.   atorvastatin (LIPITOR) 40 MG tablet Take 40 mg by mouth at bedtime.   Cholecalciferol (VITAMIN D) 50 MCG (2000 UT) CAPS Take 2,000 Units by mouth daily.   clobetasol cream (TEMOVATE) 0.05 % Apply 1 application  topically as needed. For itching   Cyanocobalamin (VITAMIN B 12 PO) Take 1,000 mg by mouth daily.   esomeprazole (NEXIUM) 40 MG capsule Take 1 capsule by mouth daily.   ezetimibe (ZETIA) 10 MG  tablet Take 10 mg by mouth daily.   nitroGLYCERIN (NITROSTAT) 0.4 MG SL tablet Place 0.4 mg under the tongue every 5 (five) minutes as needed for chest pain.     Allergies:   Penicillins, Prednisone, and Sulfa antibiotics   Social History   Socioeconomic History   Marital status: Married    Spouse name: Not on file   Number of children: Not on file   Years of education: Not on file   Highest education level: Not on file  Occupational History   Not on file  Tobacco Use   Smoking status: Never   Smokeless tobacco: Never  Vaping Use  Vaping Use: Never used  Substance and Sexual Activity   Alcohol use: Not Currently   Drug use: No   Sexual activity: Not on file  Other Topics Concern   Not on file  Social History Narrative   Not on file   Social Determinants of Health   Financial Resource Strain: Not on file  Food Insecurity: Not on file  Transportation Needs: Not on file  Physical Activity: Not on file  Stress: Not on file  Social Connections: Not on file     Family History: The patient's family history includes CAD in her father; COPD in her mother; Heart attack in her father; Stomach cancer in her maternal grandmother.  ROS:   Please see the history of present illness.    All other systems reviewed and are negative.  EKGs/Labs/Other Studies Reviewed:    The following studies were reviewed today: EKG reveals sinus rhythm with right bundle branch block and nonspecific ST-T changes   Recent Labs: 08/05/2021: ALT 14 10/26/2021: BUN 17; Creatinine, Ser 0.70; Hemoglobin 14.6; Potassium 3.9; Sodium 144  Recent Lipid Panel    Component Value Date/Time   CHOL 154 08/05/2021 0818   TRIG 121 08/05/2021 0818   HDL 48 08/05/2021 0818   CHOLHDL 3.2 08/05/2021 0818   LDLCALC 84 08/05/2021 0818    Physical Exam:    VS:  BP (!) 140/86 (BP Location: Right Arm, Patient Position: Sitting, Cuff Size: Normal)   Pulse 66   Ht 4\' 11"  (1.499 m)   Wt 157 lb (71.2 kg)    SpO2 95%   BMI 31.71 kg/m     Wt Readings from Last 3 Encounters:  07/18/22 157 lb (71.2 kg)  10/26/21 160 lb (72.6 kg)  08/04/21 159 lb 12.8 oz (72.5 kg)     GEN: Patient is in no acute distress HEENT: Normal NECK: No JVD; No carotid bruits LYMPHATICS: No lymphadenopathy CARDIAC: Hear sounds regular, 2/6 systolic murmur at the apex. RESPIRATORY:  Clear to auscultation without rales, wheezing or rhonchi  ABDOMEN: Soft, non-tender, non-distended MUSCULOSKELETAL:  No edema; No deformity  SKIN: Warm and dry NEUROLOGIC:  Alert and oriented x 3 PSYCHIATRIC:  Normal affect   Signed, Garwin Brothers, MD  07/18/2022 10:40 AM    [facility]

## 2022-07-18 NOTE — Patient Instructions (Signed)
Medication Instructions:  Your physician recommends that you continue on your current medications as directed. Please refer to the Current Medication list given to you today.  *If you need a refill on your cardiac medications before your next appointment, please call your pharmacy*   Lab Work: None ordered If you have labs (blood work) drawn today and your tests are completely normal, you will receive your results only by: MyChart Message (if you have MyChart) OR A paper copy in the mail If you have any lab test that is abnormal or we need to change your treatment, we will call you to review the results.   Testing/Procedures: None ordered   Follow-Up: At [facility], you and your health needs are our priority.  As part of our continuing mission to provide you with exceptional heart care, we have created designated Provider Care Teams.  These Care Teams include your primary Cardiologist (physician) and Advanced Practice Providers (APPs -  Physician Assistants and Nurse Practitioners) who all work together to provide you with the care you need, when you need it.  We recommend signing up for the patient portal called "MyChart".  Sign up information is provided on this After Visit Summary.  MyChart is used to connect with patients for Virtual Visits (Telemedicine).  Patients are able to view lab/test results, encounter notes, upcoming appointments, etc.  Non-urgent messages can be sent to your provider as well.   To learn more about what you can do with MyChart, go to [email or web address].    Your next appointment:   12 month(s)  The format for your next appointment:   In Person  Provider:   Rajan Revankar, MD    Other Instructions none  Important Information About Sugar      

## 2022-09-09 ENCOUNTER — Other Ambulatory Visit: Payer: Self-pay | Admitting: Urology

## 2022-09-09 ENCOUNTER — Telehealth: Payer: Self-pay

## 2022-09-09 NOTE — Telephone Encounter (Signed)
   Pre-operative Risk Assessment    Patient Name: Dana Velazquez  DOB: 05-13-1945 MRN: [id number]      Request for Surgical Clearance    Procedure:   Cysto; botox 100 units, bulkamid  Date of Surgery:  Clearance 10/04/22                                 Surgeon:  Dr. Estrellita Ludwig Surgeon's Group or Practice Name:  [facility] Phone number:  [phone] Fax number:  [phone]   Type of Clearance Requested:   - Pharmacy:  Hold Aspirin please advise   Type of Anesthesia:  Not Indicated   Additional requests/questions:    SignedDione Housekeeper   09/09/2022, 3:30 PM

## 2022-09-09 NOTE — Telephone Encounter (Signed)
   Patient Name: Dana Velazquez  DOB: 1945-04-30 MRN: [id number]  Primary Cardiologist: None  Chart reviewed as part of pre-operative protocol coverage.   Patient can hold aspirin 5 to 7 days prior to procedure and should restart postprocedure under guidance of urology specialist.   Napoleon Form, Leodis Rains, NP 09/09/2022, 3:45 PM

## 2022-09-26 ENCOUNTER — Encounter (HOSPITAL_BASED_OUTPATIENT_CLINIC_OR_DEPARTMENT_OTHER): Payer: Self-pay | Admitting: Urology

## 2022-09-26 ENCOUNTER — Other Ambulatory Visit: Payer: Self-pay

## 2022-09-26 NOTE — Progress Notes (Signed)
Spoke w/ via phone for pre-op interview---pt Lab needs dos---I stat-               Lab results------none COVID test -----patient states asymptomatic no test needed Arrive at -------830 10-04-2022 NPO after MN NO Solid Food.  Clear liquids from MN until---730 Med rec completed Medications to take morning of surgery -----ezetimibe, esomeprazole Diabetic medication -----n/a Patient instructed no nail polish to be worn day of surgery Patient instructed to bring photo id and insurance card day of surgery Patient aware to have Driver (ride ) / caregiver   Actuary husband  for 24 hours after surgery  Patient Special Instructions -----none Pre-Op special Instructions -----none Patient verbalized understanding of instructions that were given at this phone interview. Patient denies shortness of breath, chest pain, fever, cough at this phone interview. Pt states she has never used nitroglycerin.  Clearance to stop 81 mg asa 5 days before surgery Robin Searing 09-09-2022 chart/epic, pt stated instructed per dr pace to sop 81 mg asa 5 days before surgery.  [facility] cardiology dr Tomie China 07-18-2022 epic EKG 07-18-2022 epic Ct coronary 03-13-2020 epic

## 2022-10-03 NOTE — H&P (Signed)
CC/HPI: cc: Urinary incontinence and pelvic organ prolapse   08/16/2021: 77 year old woman with history of worsening urinary urgency and urge incontinence as well as pelvic organ prolapse referred for evaluation. Patient reports trying 3 different medications for overactive bladder however cannot remember which ones they were. Patient frequently has accidents at home. She does not get frequent UTIs and he denies any gross hematuria. She has very occasional stress urinary incontinence. She has nocturia 2-3 times a night. She is wearing pads daily.   09/15/2021: 77 year old woman with a history of worsening urinary urgency and urge incontinence here for follow-up after urodynamic study. Patient was noted to have overactive bladder on urinary study. She had no stress urinary incontinence with or without prolapse reduced.   Overactive bladder validated question screener: 3, 0, 4, 4, 4, 4, 4=27/40 UDS SUMMARY  Dana Velazquez held a max capacity of approx. 304 mls. Her 1st sensation was felt at 153 mls. No positive SUI noted with and without prolapse reduction. There was positive instability. A few unstable contractions noted. Some of these contractions were low amplitude. She was able to generate a voluntary contraction and void 262 mls with max flow of 17 ml/s. EMG leads were basically quiet during voiding. PVR was approx. 0 mls per PVR imaging. Some urine fell on the floor. Trabeculation was noted. No reflux was noted.   11/10/2021: 77 year old woman with a history of urinary frequency, urgency and urge incontinence here for follow-up following injection of Botox 100 units. She has done well since the procedure and urinary symptoms have improved. She is been able to bend over without leaking. She does note that she does not always have a strong stream. No dysuria or hematuria. She is going to see a spine surgeon later this week for her lumbar stenosis.   08/29/2022: 77 year old woman with a history of urinary  frequency, urgency and urge incontinence who underwent 100 units of intravesical Botox in October 2023 here for follow-up. She initially had some improvement in symptoms but quickly returned to her baseline. She is wearing a pad and depends daily. She leaks when moving from sitting to standing as well as throughout the day.     ALLERGIES: Penicillin Sulfa    MEDICATIONS: Aspirin  Alendronate Sodium 70 mg tablet  Atorvastatin Calcium 40 mg tablet  Clobetasol Propionate 0.05 % ointment  Esomeprazole Magnesium 40 mg capsule,delayed release  Ezetimibe 10 mg tablet  Ibuprofen 600 mg tablet  Vitamin B12  Vitamin D3     GU PSH: Complex cystometrogram, w/ void pressure and urethral pressure profile studies, any technique - 09/01/2021 Complex Uroflow - 09/01/2021 Cystourethroscopy, W/Injection For Chemodenervation Of Bladder - 10/26/2021 Emg surf Electrd - 09/01/2021 Inject For cystogram - 09/01/2021 Intrabd voidng Press - 09/01/2021     NON-GU PSH: Cataract surgery Knee replacement Neck Surgery (Unspecified)     GU PMH: Female genital prolapse, unspecified - 11/10/2021, - 08/16/2021 Urge incontinence - 11/10/2021, - 09/15/2021, - 09/01/2021, - 08/16/2021 Urinary Frequency - 11/10/2021, - 09/15/2021, - 08/16/2021 Urinary Urgency - 11/10/2021, - 09/15/2021, - 08/16/2021    NON-GU PMH: Arthritis Hypercholesterolemia Sleep Apnea    FAMILY HISTORY: 1 Daughter - Runs in Family 1 son - Runs in Family   SOCIAL HISTORY: Marital Status: Married Preferred Language: English; Ethnicity: Not Hispanic Or Latino; Race: White Current Smoking Status: Patient has never smoked.   Tobacco Use Assessment Completed: Used Tobacco in last 30 days? Has never drank.  Does not drink caffeine.    REVIEW OF  SYSTEMS:    GU Review Female:   Patient denies leakage of urine, get up at night to urinate, frequent urination, being pregnant, have to strain to urinate, stream starts and stops, trouble starting your  stream, burning /pain with urination, and hard to postpone urination.  Gastrointestinal (Upper):   Patient denies nausea, vomiting, and indigestion/ heartburn.  Gastrointestinal (Lower):   Patient denies diarrhea and constipation.  Constitutional:   Patient denies fever, night sweats, weight loss, and fatigue.  Skin:   Patient denies skin rash/ lesion and itching.  Eyes:   Patient denies blurred vision and double vision.  Ears/ Nose/ Throat:   Patient denies sore throat and sinus problems.  Hematologic/Lymphatic:   Patient denies swollen glands and easy bruising.  Cardiovascular:   Patient denies leg swelling and chest pains.  Respiratory:   Patient denies cough and shortness of breath.  Endocrine:   Patient denies excessive thirst.  Musculoskeletal:   Patient denies back pain and joint pain.  Neurological:   Patient denies headaches and dizziness.  Psychologic:   Patient denies depression and anxiety.   VITAL SIGNS: None   MULTI-SYSTEM PHYSICAL EXAMINATION:    Constitutional: Well-nourished. No physical deformities. Normally developed. Good grooming.  Neck: Neck symmetrical, not swollen. Normal tracheal position.  Respiratory: No labored breathing, no use of accessory muscles.   Skin: No paleness, no jaundice, no cyanosis. No lesion, no ulcer, no rash.  Neurologic / Psychiatric: Oriented to time, oriented to place, oriented to person. No depression, no anxiety, no agitation.  Eyes: Normal conjunctivae. Normal eyelids.  Ears, Nose, Mouth, and Throat: Left ear no scars, no lesions, no masses. Right ear no scars, no lesions, no masses. Nose no scars, no lesions, no masses. Normal hearing. Normal lips.  Musculoskeletal: Normal gait and station of head and neck.     Complexity of Data:  Records Review:   Previous Patient Records, POC Tool  Urine Test Review:   Urinalysis  Urodynamics Review:   Review Bladder Scan   PROCEDURES:         PVR Ultrasound - 42595  Scanned Volume: 128 cc          Urinalysis w/Scope Dipstick Dipstick Cont'd Micro  Color: Yellow Bilirubin: Neg mg/dL WBC/hpf: 6 - 63/OVF  Appearance: Slightly Cloudy Ketones: Neg mg/dL RBC/hpf: NS (Not Seen)  Specific Gravity: 1.015 Blood: Neg ery/uL Bacteria: Mod (26-50/hpf)  pH: 5.5 Protein: Neg mg/dL Cystals: NS (Not Seen)  Glucose: Neg mg/dL Urobilinogen: 0.2 mg/dL Casts: NS (Not Seen)    Nitrites: Neg Trichomonas: Not Present    Leukocyte Esterase: 3+ leu/uL Mucous: Not Present      Epithelial Cells: 0 - 5/hpf      Yeast: NS (Not Seen)      Sperm: Not Present    Notes: Renal Tubular epithelium present    ASSESSMENT:      ICD-10 Details  1 GU:   Urinary Frequency - R35.0 Chronic, Stable  2   Urinary Urgency - R39.15 Chronic, Stable  3   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 We reviewed options including repeat Botox injections as she did have some improvement. We could also add Bulkamid to see if this would help with the leakage when moving  from sitting to standing. Risks and benefits of both these procedures were discussed with the patient in detail including but not limited to pain, bleeding, infection and urinary retention requiring indwelling Foley catheter. I discussed with patient that we really have limited options moving forward for the incontinence. I last line option would be an indwelling Foley catheter or suprapubic tube. Patient is not interested in this. We will move forward with Botox and Bulkamid in the operating room. Will plan on the 100 units as patient does have a slightly elevated PVR at 128cc.

## 2022-10-04 ENCOUNTER — Ambulatory Visit (HOSPITAL_BASED_OUTPATIENT_CLINIC_OR_DEPARTMENT_OTHER)
Admission: RE | Admit: 2022-10-04 | Discharge: 2022-10-04 | Disposition: A | Discharge status: Home / Self Care | Payer: Medicare Other | Source: Ambulatory Visit | Attending: Urology | Admitting: Urology

## 2022-10-04 ENCOUNTER — Ambulatory Visit (HOSPITAL_BASED_OUTPATIENT_CLINIC_OR_DEPARTMENT_OTHER): Payer: Medicare Other | Admitting: Anesthesiology

## 2022-10-04 ENCOUNTER — Encounter (HOSPITAL_BASED_OUTPATIENT_CLINIC_OR_DEPARTMENT_OTHER): Payer: Self-pay | Admitting: Urology

## 2022-10-04 ENCOUNTER — Encounter (HOSPITAL_BASED_OUTPATIENT_CLINIC_OR_DEPARTMENT_OTHER)
Admission: RE | Disposition: A | Discharge status: Home / Self Care | Payer: Self-pay | Source: Ambulatory Visit | Attending: Urology

## 2022-10-04 DIAGNOSIS — N3289 Other specified disorders of bladder: Secondary | ICD-10-CM | POA: Insufficient documentation

## 2022-10-04 DIAGNOSIS — N3946 Mixed incontinence: Secondary | ICD-10-CM | POA: Diagnosis present

## 2022-10-04 DIAGNOSIS — R32 Unspecified urinary incontinence: Secondary | ICD-10-CM | POA: Diagnosis not present

## 2022-10-04 DIAGNOSIS — G4730 Sleep apnea, unspecified: Secondary | ICD-10-CM | POA: Diagnosis not present

## 2022-10-04 DIAGNOSIS — I2510 Atherosclerotic heart disease of native coronary artery without angina pectoris: Secondary | ICD-10-CM | POA: Diagnosis not present

## 2022-10-04 DIAGNOSIS — N8110 Cystocele, unspecified: Secondary | ICD-10-CM | POA: Insufficient documentation

## 2022-10-04 DIAGNOSIS — N3281 Overactive bladder: Secondary | ICD-10-CM

## 2022-10-04 DIAGNOSIS — Z01818 Encounter for other preprocedural examination: Secondary | ICD-10-CM

## 2022-10-04 HISTORY — PX: CYSTOSCOPY WITH INJECTION: SHX1424

## 2022-10-04 LAB — POCT I-STAT, CHEM 8
BUN: 14 mg/dL (ref 8–23)
Calcium, Ion: 1.25 mmol/L (ref 1.15–1.40)
Chloride: 107 mmol/L (ref 98–111)
Creatinine, Ser: 0.7 mg/dL (ref 0.44–1.00)
Glucose, Bld: 93 mg/dL (ref 70–99)
HCT: 45 % (ref 36.0–46.0)
Hemoglobin: 15.3 g/dL — ABNORMAL HIGH (ref 12.0–15.0)
Potassium: 4.2 mmol/L (ref 3.5–5.1)
Sodium: 143 mmol/L (ref 135–145)
TCO2: 24 mmol/L (ref 22–32)

## 2022-10-04 SURGERY — CYSTOSCOPY, WITH INJECTION OF BLADDER NECK OR BLADDER WALL
Anesthesia: Monitor Anesthesia Care

## 2022-10-04 MED ORDER — ONABOTULINUMTOXINA 100 UNITS IJ SOLR
INTRAMUSCULAR | Status: DC | PRN
  Administered 2022-10-04: 100 [IU] via INTRAMUSCULAR

## 2022-10-04 MED ORDER — DEXAMETHASONE SODIUM PHOSPHATE 4 MG/ML IJ SOLN
INTRAMUSCULAR | Status: DC | PRN
  Administered 2022-10-04: 5 mg via INTRAVENOUS

## 2022-10-04 MED ORDER — FENTANYL CITRATE (PF) 100 MCG/2ML IJ SOLN
INTRAMUSCULAR | Status: AC
  Filled 2022-10-04: qty 2

## 2022-10-04 MED ORDER — LACTATED RINGERS IV SOLN: INTRAVENOUS | Status: DC

## 2022-10-04 MED ORDER — CIPROFLOXACIN IN D5W 400 MG/200ML IV SOLN
400.0000 mg | INTRAVENOUS | Status: AC
  Administered 2022-10-04: 400 mg via INTRAVENOUS

## 2022-10-04 MED ORDER — SODIUM CHLORIDE (PF) 0.9 % IJ SOLN
INTRAMUSCULAR | Status: DC | PRN
  Administered 2022-10-04: 10 mL

## 2022-10-04 MED ORDER — PROPOFOL 500 MG/50ML IV EMUL
INTRAVENOUS | Status: AC
  Filled 2022-10-04: qty 50

## 2022-10-04 MED ORDER — ONDANSETRON HCL 4 MG/2ML IJ SOLN: 4.0000 mg | Freq: Once | INTRAMUSCULAR | Status: DC | PRN

## 2022-10-04 MED ORDER — ONDANSETRON HCL 4 MG/2ML IJ SOLN
INTRAMUSCULAR | Status: DC | PRN
  Administered 2022-10-04: 4 mg via INTRAVENOUS

## 2022-10-04 MED ORDER — LIDOCAINE HCL (CARDIAC) PF 100 MG/5ML IV SOSY
PREFILLED_SYRINGE | INTRAVENOUS | Status: DC | PRN
  Administered 2022-10-04: 60 mg via INTRAVENOUS

## 2022-10-04 MED ORDER — PROPOFOL 10 MG/ML IV BOLUS
INTRAVENOUS | Status: DC | PRN
  Administered 2022-10-04 (×2): 10 mg via INTRAVENOUS
  Administered 2022-10-04: 20 mg via INTRAVENOUS
  Administered 2022-10-04: 10 mg via INTRAVENOUS

## 2022-10-04 MED ORDER — ONDANSETRON HCL 4 MG/2ML IJ SOLN
INTRAMUSCULAR | Status: AC
  Filled 2022-10-04: qty 2

## 2022-10-04 MED ORDER — PROPOFOL 500 MG/50ML IV EMUL
INTRAVENOUS | Status: DC | PRN
  Administered 2022-10-04: 50 ug/kg/min via INTRAVENOUS

## 2022-10-04 MED ORDER — FENTANYL CITRATE (PF) 100 MCG/2ML IJ SOLN
INTRAMUSCULAR | Status: DC | PRN
  Administered 2022-10-04 (×2): 25 ug via INTRAVENOUS

## 2022-10-04 MED ORDER — WATER FOR IRRIGATION, STERILE IR SOLN
Status: DC | PRN
  Administered 2022-10-04: 3000 mL via INTRAVESICAL

## 2022-10-04 MED ORDER — LIDOCAINE HCL (PF) 2 % IJ SOLN
INTRAMUSCULAR | Status: AC
  Filled 2022-10-04: qty 5

## 2022-10-04 MED ORDER — CIPROFLOXACIN IN D5W 400 MG/200ML IV SOLN
INTRAVENOUS | Status: AC
  Filled 2022-10-04: qty 200

## 2022-10-04 MED ORDER — ACETAMINOPHEN 10 MG/ML IV SOLN: 1000.0000 mg | Freq: Once | INTRAVENOUS | Status: DC | PRN

## 2022-10-04 MED ORDER — DEXAMETHASONE SODIUM PHOSPHATE 10 MG/ML IJ SOLN
INTRAMUSCULAR | Status: AC
  Filled 2022-10-04: qty 1

## 2022-10-04 MED ORDER — FENTANYL CITRATE (PF) 100 MCG/2ML IJ SOLN
25.0000 ug | INTRAMUSCULAR | Status: DC | PRN
  Administered 2022-10-04 (×2): 25 ug via INTRAVENOUS

## 2022-10-04 SURGICAL SUPPLY — 20 items
BAG DRAIN URO-CYSTO SKYTR STRL (DRAIN) ×1 IMPLANT
BAG DRN UROCATH (DRAIN) ×1
CLOTH BEACON ORANGE TIMEOUT ST (SAFETY) ×1 IMPLANT
ELECT REM PT RETURN 9FT ADLT (ELECTROSURGICAL) ×1
ELECTRODE REM PT RTRN 9FT ADLT (ELECTROSURGICAL) ×1 IMPLANT
GLOVE BIO SURGEON STRL SZ 6.5 (GLOVE) ×1 IMPLANT
GOWN STRL REUS W/TWL LRG LVL3 (GOWN DISPOSABLE) ×1 IMPLANT
KIT TURNOVER CYSTO (KITS) ×1 IMPLANT
MANIFOLD NEPTUNE II (INSTRUMENTS) ×1 IMPLANT
NDL ASPIRATION 22 (NEEDLE) IMPLANT
NDL SAFETY ECLIP 18X1.5 (MISCELLANEOUS) ×1 IMPLANT
NEEDLE ASPIRATION 22 (NEEDLE)
PACK CYSTO (CUSTOM PROCEDURE TRAY) ×1 IMPLANT
SLEEVE SCD COMPRESS KNEE MED (STOCKING) ×1 IMPLANT
SYR 20ML LL LF (SYRINGE) ×1 IMPLANT
SYR CONTROL 10ML LL (SYRINGE) ×1 IMPLANT
SYSTEM URETHRAL BULK BULKAMID (Female Continence) IMPLANT
TUBE CONNECTING 12X1/4 (SUCTIONS) ×1 IMPLANT
TUBING UROLOGY SET (TUBING) ×1 IMPLANT
WATER STERILE IRR 3000ML UROMA (IV SOLUTION) ×1 IMPLANT

## 2022-10-04 NOTE — Op Note (Signed)
Operative Note   Preoperative diagnosis:  1.  Mixed urinary incontinence   Postoperative diagnosis: 1.  Mixed urinary incontinence   Procedure(s): 1.  Cystoscopy with injection of bulkamid 2.  100 units intravesical Botox   Surgeon: Kasandra Knudsen, MD   Assistants:  None   Anesthesia:  General   Complications:  None   EBL:  minimal   Specimens: 1. none   Drains/Catheters: 1.  none   Intraoperative findings:   Normal urethra Grade 3 cystocele Moderate to severe bladder trabeculation Bilateral orthotopic ureteral orifices seen and avoided with Botox   Indication: 77 year old woman with mixed urinary incontinence who was previously undergone intravesical Botox.  She is here today for both Botox and Bulkamid.  Description of procedure:   After risks and benefits of the procedure discussed with the patient, informed consent was obtained.  The patient was taken to the operating placed in the supine position.  Anesthesia was induced and antibiotics were administered.  The patient was then repositioned in the dorsolithotomy position.  She was prepped and draped in usual sterile fashion a timeout performed with the attending present.  The injection cytoscope was placed in the urethral meatus and advanced into the bladder under direct visualization.  100 units of Botox mixed in 10 cc of sterile saline were injected in the posterior wall the bladder in standard template.  Care was taken to avoid the ureteral orifices and the trigone.  The cystoscope was removed.  Next, the cystoscope was assembled with the Bulkamid system.  It was then placed in the urethral meatus and advanced into the bladder under direct visualization.  Prior cystoscopy had been done which noted normal anatomic landmarks.  These were again verified during cystoscopy today.  The cystoscope was brought back to the bladder neck and the needle was advanced through the needle guide at the 1 o'clock position.  Once it  was visualized and advanced it was rotated to the 5 o'clock position.  Bulkamid was then injected until a bleb was seen.  This was then repeated at the 1 o'clock position in the 7 o'clock position until coaptation was noted.   This concluded the case.  The patient's bladder was left with approximately 200 cc of sterile saline.  The patient emerged from anesthesia and was transferred the PACU in stable condition.   Plan:  Plan for patient to void in PACU prior to discharge.

## 2022-10-04 NOTE — Anesthesia Preprocedure Evaluation (Signed)
Anesthesia Evaluation  Patient identified by MRN, date of birth, ID band Patient awake    Reviewed: Allergy & Precautions, Patient's Chart, lab work & pertinent test results  Airway Mallampati: II  TM Distance: >3 FB Neck ROM: Full    Dental no notable dental hx. (+) Teeth Intact, Dental Advisory Given   Pulmonary sleep apnea    Pulmonary exam normal breath sounds clear to auscultation       Cardiovascular Exercise Tolerance: Good + CAD  Normal cardiovascular exam Rhythm:Regular Rate:Normal     Neuro/Psych negative neurological ROS  negative psych ROS   GI/Hepatic Neg liver ROS,GERD  ,,  Endo/Other    Renal/GU      Musculoskeletal  (+) Arthritis ,    Abdominal   Peds  Hematology   Anesthesia Other Findings   Reproductive/Obstetrics                             Anesthesia Physical Anesthesia Plan  ASA: 3  Anesthesia Plan: MAC   Post-op Pain Management:    Induction: Intravenous  PONV Risk Score and Plan: Treatment may vary due to age or medical condition, Ondansetron, Dexamethasone and Propofol infusion  Airway Management Planned: Nasal Cannula and Natural Airway  Additional Equipment: None  Intra-op Plan:   Post-operative Plan:   Informed Consent: I have reviewed the patients History and Physical, chart, labs and discussed the procedure including the risks, benefits and alternatives for the proposed anesthesia with the patient or authorized representative who has indicated his/her understanding and acceptance.     Dental advisory given  Plan Discussed with: CRNA and Anesthesiologist  Anesthesia Plan Comments:        Anesthesia Quick Evaluation

## 2022-10-04 NOTE — Transfer of Care (Signed)
Immediate Anesthesia Transfer of Care Note  Patient: Dana Velazquez  Procedure(s) Performed: Procedure(s) (LRB): CYSTOSCOPY WITH BOTOX 100 UNITS, AND BULKAMID INJECTION (N/A)  Patient Location: PACU  Anesthesia Type: MAC  Level of Consciousness: awake, sedated, patient cooperative and responds to stimulation  Airway & Oxygen Therapy: Patient Spontanous Breathing and Patient on RA  Post-op Assessment: Report given to PACU RN, Post -op Vital signs reviewed and stable and Patient moving all extremities  Post vital signs: Reviewed and stable  Complications: No apparent anesthesia complications

## 2022-10-04 NOTE — Interval H&P Note (Signed)
History and Physical Interval Note:  10/04/2022 9:00 AM  Dana Velazquez  has presented today for surgery, with the diagnosis of OVERACTIVE BLADDER, URINARY INCONTINENCE.  The various methods of treatment have been discussed with the patient and family. After consideration of risks, benefits and other options for treatment, the patient has consented to  Procedure(s) with comments: CYSTOSCOPY WITH BOTOX 100 UNITS, AND BULKAMID INJECTION (N/A) - 45 MINUTES as a surgical intervention.  The patient's history has been reviewed, patient examined, no change in status, stable for surgery.  I have reviewed the patient's chart and labs.  Questions were answered to the patient's satisfaction.     [name] Dinita Migliaccio

## 2022-10-04 NOTE — Discharge Instructions (Addendum)
Cystoscopy with Botox and Bulkamid patient instructions  Following a cystoscopy, a catheter (a flexible rubber tube) is sometimes left in place to empty the bladder. This may cause some discomfort or a feeling that you need to urinate. Your doctor determines the period of time that the catheter will be left in place. You may have bloody urine for two to three days (Call your doctor if the amount of bleeding increases or does not subside).  You may pass blood clots in your urine, especially if you had a biopsy. It is not unusual to pass small blood clots and have some bloody urine a couple of weeks after your cystoscopy. Again, call your doctor if the bleeding does not subside. You may have: Dysuria (painful urination) Frequency (urinating often) Urgency (strong desire to urinate)  These symptoms are common especially if medicine is instilled into the bladder or a ureteral stent is placed. Avoiding alcohol and caffeine, such as coffee, tea, and chocolate, may help relieve these symptoms. Drink plenty of water, unless otherwise instructed. Your doctor may also prescribe an antibiotic or other medicine to reduce these symptoms.  Cystoscopy results are available soon after the procedure; biopsy results usually take two to four days. Your doctor will discuss the results of your exam with you. Before you go home, you will be given specific instructions for follow-up care. Special Instructions:   If you are going home with a catheter in place do not take a tub bath until removed by your doctor.   You may resume your normal activities.   Do not drive or operate machinery if you are taking narcotic pain medicine.      Be sure to keep all follow-up appointments with your doctor.   Call Your Doctor If: The catheter is not draining You have severe pain You are unable to urinate You have a fever over 101 You have severe bleeding          Post Anesthesia Home Care Instructions  Activity: Get plenty  of rest for the remainder of the day. A responsible individual must stay with you for 24 hours following the procedure.  For the next 24 hours, DO NOT: -Drive a car -Advertising copywriter -Drink alcoholic beverages -Take any medication unless instructed by your physician -Make any legal decisions or sign important papers.  Meals: Start with liquid foods such as gelatin or soup. Progress to regular foods as tolerated. Avoid greasy, spicy, heavy foods. If nausea and/or vomiting occur, drink only clear liquids until the nausea and/or vomiting subsides. Call your physician if vomiting continues.  Special Instructions/Symptoms: Your throat may feel dry or sore from the anesthesia or the breathing tube placed in your throat during surgery. If this causes discomfort, gargle with warm salt water. The discomfort should disappear within 24 hours.  Dr. Thomos Lemons office will call you today to schedule an appointment for catheter removal and voiding trial tomorrow.

## 2022-10-04 NOTE — Anesthesia Postprocedure Evaluation (Signed)
Anesthesia Post Note  Patient: Dana Velazquez  Procedure(s) Performed: CYSTOSCOPY WITH BOTOX 100 UNITS, AND BULKAMID INJECTION     Patient location during evaluation: PACU Anesthesia Type: MAC Level of consciousness: awake and alert Pain management: pain level controlled Vital Signs Assessment: post-procedure vital signs reviewed and stable Respiratory status: spontaneous breathing, nonlabored ventilation, respiratory function stable and patient connected to nasal cannula oxygen Cardiovascular status: stable and blood pressure returned to baseline Postop Assessment: no apparent nausea or vomiting Anesthetic complications: no   No notable events documented.  Last Vitals:  Vitals:   10/04/22 1045 10/04/22 1100  BP: 133/69 119/69  Pulse: (!) 57 (!) 51  Resp: 11 10  Temp:    SpO2: 96% 95%    Last Pain:  Vitals:   10/04/22 1130  TempSrc:   PainSc: 0-No pain                 Trevor Iha

## 2022-10-04 NOTE — Anesthesia Procedure Notes (Signed)
Procedure Name: MAC Date/Time: 10/04/2022 9:49 AM  Performed by: Jessica Priest, CRNAPre-anesthesia Checklist: Timeout performed, Patient being monitored, Suction available, Emergency Drugs available and Patient identified Patient Re-evaluated:Patient Re-evaluated prior to induction Oxygen Delivery Method: Simple face mask Preoxygenation: Pre-oxygenation with 100% oxygen Induction Type: IV induction Placement Confirmation: breath sounds checked- equal and bilateral, CO2 detector and positive ETCO2

## 2022-10-06 ENCOUNTER — Encounter (HOSPITAL_BASED_OUTPATIENT_CLINIC_OR_DEPARTMENT_OTHER): Payer: Self-pay | Admitting: Urology

## 2023-02-07 NOTE — Progress Notes (Signed)
 [place]  Patient ID: Dana Velazquez is a 78 y.o. female  10-25-1945  [facility]- Jenel [facility]ersmith [address]  [address] [place], [place]  [address]  Phone: [phone]  Requesting Provider:  No ref. provider found  Assessment/Plan:    1. Impacted cerumen of left ear      2. Perforation of left tympanic membrane      3. ETD (Eustachian tube dysfunction), left        Dear  Thank you for allowing me to participate in your patient's care, Dana Velazquez.    This is a patient who I evaluated today at your request.   If you have any questions or concerns please call my office. I would be happy to speak with you.     Thank you again for involving me in your patients care.  Sincerely,  Lamar Gardiner Dwana Burnie, M.D.  [facility] [address], [address] [place], [place]  [address]  Phone  [phone] Fax      [phone]   Subjective:   Reason for visit:    Chief Complaint  Patient presents with   Follow-up    Dana Velazquez returns today for f/u with (L) tube; DOS: 12/17/2020.   History of Present Illness:  Dana Velazquez is a 78 y.o. year old female who returns today for follow up with left tympanostomy tube. Patient had tube placed on 12/17/2020. No recent ear issues.   The patient denies chest pain, shortness of breath, abdominal pain or GI upset.  Critical exam findings and Assessment and Plan:  Return to clinic in 6 to 8 months.  We will follow the left-sided perforation for closure and healing.  Recommend water  precautions on the left.  PPE that was worn during the encounter was gloves.  Objective:   Past Medical History: Past Medical History:  Diagnosis Date   At high risk for falls    Cervical spine pain    Coronary artery disease 06/02/2020   per CT managed by Dr Edwyna; no hx MI or interventions   DDD (degenerative disc disease), lumbar    GERD (gastroesophageal reflux disease)     Hypercholesterolemia    Hyperlipidemia    Left ankle swelling    MRSA (methicillin resistant Staphylococcus aureus) 10/13/2020   nasal culture +   Osteoarthritis    generalized   RBBB (right bundle branch block)    per [facility] EKG 07/2021   Urinary incontinence    Wears glasses     Past Surgical History:. Past Surgical History:  Procedure Laterality Date   ANTERIOR CERVICAL DISCECTOMY W/ FUSION N/A 10/19/2020   Procedure: C3-4 ANTERIOR CERVICAL DISCECTOMY & FUSION W/ PLATE, ILIAC CREST BONE GRAFT AND ALLOGRAFT;  Surgeon: Royden Elsie Schneider, MD;  Location: [facility] MAIN OR;  Service: Orthopedics;  Laterality: N/A;  Supine, Leica Microscope, Shadowline Retractor, Chief Technology Officer, Slider/Skytron, [employer], [employer], PACS   BOTOX  THERAPY  09/2021   Procedure: BOTOX  INTRAVESICAL BLADDER INJECTION   CATARACT EXTRACTION Bilateral    Procedure: CATARACT EXTRACTION   EYE SURGERY Left    Procedure: EYE SURGERY; tear duct   FOOT SURGERY Bilateral    Procedure: FOOT SURGERY; toes-bones and corn   JOINT REPLACEMENT Left    Procedure: JOINT REPLACEMENT; knee   PARTIAL HYSTERECTOMY     Procedure: PARTIAL HYSTERECTOMY   SHOULDER SURGERY Right    Procedure: SHOULDER SURGERY; bone involvement   TYMPANOSTOMY TUBE PLACEMENT Left 12/17/2020  Procedure: TYMPANOSTOMY TUBE PLACEMENT   Medications: Current Outpatient Medications  Medication Sig Dispense Refill   alendronate (FOSAMAX) 70 mg tablet Take 70 mg by mouth every 7 days.  1   ammonium lactate (LAC-HYDRIN) 12 % lotion Apply  topically as needed.  0   aspirin  81 mg EC tablet Take 81 mg by mouth Once Daily.     atorvastatin (LIPITOR) 40 mg tablet Take 40 mg by mouth nightly.  0   calcium carbonate-vitamin D3 500 mg-2.5 mcg (100 unit) chew Chew 1 tablet daily as needed.     cholecalciferol (VITAMIN D3) 2,000 unit cap capsule Take 2,000 Units by mouth daily.     clobetasoL (TEMOVATE) 0.05 % cream Apply 1 Application  topically 2 (two) times a day as needed.     cyanocobalamin (VITAMIN B12) 500 mcg tablet Take 500 mcg by mouth daily.     ergocalciferol , vitamin D2, (VITAMIN D2 ORAL) Take by mouth.     esomeprazole (NexIUM) 40 mg DR capsule Take 40 mg by mouth daily before breakfast.  1   ezetimibe (ZETIA) 10 mg tablet Take 10 mg by mouth Once Daily.     hydrOXYzine (ATARAX) 10 mg tablet Take 10 mg by mouth 3 (three) times a day as needed.     ibuprofen (MOTRIN) 600 mg tablet Take 600 mg by mouth every 6 (six) hours as needed (pain). 30 tablet 0   meloxicam (MOBIC) 7.5 mg tablet      nystatin (MYCOSTATIN) 100,000 unit/gram cream Apply 1 Application topically.     No current facility-administered medications for this visit.    Allergies: Allergies  Allergen Reactions   Penicillin Itching   Prednisone Other (See Comments)    Appetite change and cannot sleep    Sulfa (Sulfonamide Antibiotics) Other (See Comments)    Unknown reaction     Family History: Family History  Problem Relation Name Age of Onset   COPD Mother     Heart disease Father     Stomach cancer Maternal Grandmother      Social History: Social History   Social History Narrative   Not on file   Social History   Tobacco Use  Smoking Status Never  Smokeless Tobacco Never     Review of Systems:  A complete review of systems was performed with positive and negative pertinent findings listed in the history of present illness.  All other systems are negative  VITAL SIGNS: BP 144/83 (BP Location: Left arm, Patient Position: Sitting)   Pulse 69   Temp 98 F (36.7 C)   Ht 1.499 m (4' 11)   Wt 73 kg (161 lb)   BMI 32.52 kg/m   Physical Exam:   General: pleasant, sitting up in NAD, normal appearance and voice. Respiration:  Breathing comfortably, no stridor.  Eyes:  EOM Intact, sclera anicteric, no conjunctival injection.  Neuro:  A&Ox3, Cranial nerves 2-12 intact and symmetric.  Facial Nerve intact.  Normal affect.  Integumentary: Skin of the face and neck with no masses or lesions Salivary Glands:  Parotid and submandibular glands normal bilaterally. No masses or palpable abnormalities. Clear saliva from submandibular and parotid ducts.   Ears:  External Ears are normal.  Normal ear exam bilaterally 10% inferior tympanic membrane perforation.  Nose:  External nose midline  normal nasal airway, septum in the midline  Oral Cavity/Lips: normal mucosa, no lesions, tongue mobile,  healthy dentition and gingiva Oropharynx:  no masses or lesions.  BOT soft, soft palate elevates symmetrically  in the midline, tonsils 1+  Pharyngeal Walls and Nasopharynx:  No masses noted. Neck/Lymph:  No lymphadenopathy,  No neck or thyroid masses, trachea is in the midline.  the larynx was not examined today  Ear Cerumen Removal  Date/Time: 02/07/2023 9:00 AM  Performed by: Lamar Tamar Burnie PONCE, MD Authorized by: Lamar Tamar Burnie PONCE, MD   Procedure Details:  Impacted cerumen: Yes Location details: left Procedure type: curette  Post Procedure Details:  Procedure findings: complete impaction removal Patient tolerance of procedure: patient tolerated the procedure without difficulty Comments: The patient's left ear was examined with a clinic microscope and I used various instruments to clean cerumen including a straight pick including an alligator.  Additionally there was a partially extruded tympanostomy tube that was removed with the cerumen.  The patient does have a 10% inferior tympanic membrane perforation.    This document serves as a record of services personally performed by Dr. Lamar Burnie.  It was created on their behalf by Harlene Pietro Loss, CMA, a trained medical scribe, and Certified Medical Assistant (CMA). During the course of documenting the history, physical exam and medical decision making, I was functioning as a stage manager. The creation of this record is the  providers dictation and/or activities during the visit.  Electronically signed by Harlene Pietro Loss, CMA 02/07/2023 9:19 AM    I agree the documentation is accurate and complete.  Lamar Tamar Burnie PONCE, MD MD

## 2023-06-25 NOTE — ED Provider Notes (Signed)
 ------------------------------------------------------------------------------- Attestation signed by Perla Ozell Hamilton, MD at 06/26/23 [phone] I attest to being available for consultation and questions during this encounter. -------------------------------------------------------------------------------  [facility] Emergency Department Provider Note  ED Clinical Impression   Final diagnoses:  Arm skin lesion, left (Primary)    ED Assessment/Plan & Course   EMBERLIE GOTCHER is a 78 y.o. female with a past medical history of high cholesterol, who presents to the ED for evaluation of left forearm discoloration, which began 3 weeks ago but has spread in the last 2 days.   On exam patient is well-appearing in no acute distress.  Triage vital signs unremarkable.  Left arm with area of ecchymosis.  No erythema, induration or fluctuance.  Nontender.  Radial pulse 2+.  Appears consistent with ecchymosis/bruising.  Limited to bilateral forearms.  Plan to check CBC, CMP, INR, CRP and ESR.  9:40 PM Informed by nurse that patient decided to leave and will follow-up with her primary care provider.  Labs not drawn.  Portions of this record have been created using Scientist, clinical (histocompatibility and immunogenetics). Dictation errors have been sought, but may not have been identified and corrected.        ____________________________________________   History   Chief Complaint  Bleeding/Bruising   HPI  KOLBIE CLARKSTON is a 78 y.o. female with a past medical history of high cholesterol, who presents to the ED for evaluation of left forearm discoloration, which began 3 weeks ago but has spread in the last 2 days. She states she has seen 2 doctors for the same complaint and was told to present to the ED if it worsens. Pt endorses itching.  She states she notices itching in the area and then scratches and then the discoloration begins.  She denies pain, fevers, chest pain, SOB, and recent medication changes.   Pt  has seen dermatology with no diagnosis.   Pt's daughter present at bedside.   Past Medical History[1]  Problem List[2]  Past Surgical History[3]  No current facility-administered medications for this encounter.  Current Outpatient Medications:    alendronate (FOSAMAX) 70 MG tablet, , Disp: , Rfl:    atorvastatin (LIPITOR) 40 MG tablet, TK 1 T PO HS, Disp: , Rfl:    cyclobenzaprine  (FLEXERIL ) 10 MG tablet, Take 1 tablet (10 mg total) by mouth Three (3) times a day as needed for muscle spasms (back pain)., Disp: 30 each, Rfl: 0   esomeprazole (NEXIUM) 40 MG capsule, TK 1 C PO D, Disp: , Rfl:    meloxicam (MOBIC) 15 MG tablet, TK 1 T PO D TK WITH FOOD, Disp: , Rfl:    MYRBETRIQ 50 mg Tb24 extended-release tablets, TK 1 T PO HS, Disp: , Rfl:   Allergies Penicillins  Family History[4]  Social History Short Social History[5]  Review of Systems Review of Systems  Skin:  Positive for color change (Bruising).  All other systems reviewed and are negative.   Physical Exam   ED Triage Vitals [06/25/23 2054]  Enc Vitals Group     BP 137/89     Pulse 90     SpO2 Pulse      Resp 16     Temp 37 C (98.6 F)     Temp Source Oral     SpO2 98 %     Weight 72.6 kg (160 lb)     Height 1.499 m (4' 11)   Physical Exam Vitals and nursing note reviewed.  Constitutional:      Appearance: She  is not toxic-appearing or diaphoretic.  HENT:     Head: Normocephalic and atraumatic.   Cardiovascular:     Rate and Rhythm: Normal rate and regular rhythm.  Pulmonary:     Effort: Pulmonary effort is normal.     Breath sounds: Normal breath sounds.   Musculoskeletal:     Cervical back: Normal range of motion and neck supple.   Skin:    General: Skin is warm and dry.     Comments: Right forearm with dorsal discoloration consistent with bruising/ecchymosis.  Nonblanching.  Nontender.  No abnormal calor.   Neurological:     Mental Status: She is alert.      EKG    Labs    No results found for this visit on 06/25/23.  Radiology   No orders to display    Procedures    Portions of this record have been created using Dragon dictation software. Dictation errors have been sought, but may not have been identified and corrected.  The chart was modified by Lendon Amor using scribe services on June 25, 2023 9:12 PM, on behalf of Carlin Hopping, PA-C.  Documentation assistance was provided by the scribe in my presence. The documentation recorded by the scribe has been reviewed by me, amended with [employer], and accurately reflects the services I personally performed.          [1] Past Medical History: Diagnosis Date   High cholesterol   [2] There is no problem list on file for this patient.  [3] Past Surgical History: Procedure Laterality Date   JOINT REPLACEMENT    [4] History reviewed. No pertinent family history. [5] Social History Tobacco Use   Smoking status: Never   Smokeless tobacco: Never  Substance Use Topics   Alcohol use: Never   Drug use: Never   Hopping Carlin Hensen, [place] 06/25/23 2143

## 2023-07-12 ENCOUNTER — Other Ambulatory Visit: Payer: Self-pay

## 2023-07-13 ENCOUNTER — Ambulatory Visit: Attending: Cardiology | Admitting: Cardiology

## 2023-07-13 ENCOUNTER — Encounter: Payer: Self-pay | Admitting: Cardiology

## 2023-07-13 VITALS — BP 128/78 | HR 64 | Ht 59.0 in | Wt 160.6 lb

## 2023-07-13 DIAGNOSIS — I10 Essential (primary) hypertension: Secondary | ICD-10-CM | POA: Diagnosis not present

## 2023-07-13 DIAGNOSIS — E782 Mixed hyperlipidemia: Secondary | ICD-10-CM

## 2023-07-13 DIAGNOSIS — E66811 Obesity, class 1: Secondary | ICD-10-CM

## 2023-07-13 DIAGNOSIS — I2510 Atherosclerotic heart disease of native coronary artery without angina pectoris: Secondary | ICD-10-CM | POA: Diagnosis not present

## 2023-07-13 NOTE — Progress Notes (Signed)
 Cardiology Office Note:    Date:  07/13/2023   ID:  Dana Velazquez, DOB [birth year], MRN [id number]  PCP:  Keren Vicenta BRAVO, MD  Cardiologist:  Jennifer JONELLE Crape, MD   Referring MD: Keren Vicenta BRAVO, MD    ASSESSMENT:    1. Coronary artery disease involving native coronary artery of native heart without angina pectoris   2. Benign essential hypertension   3. Mixed dyslipidemia   4. Obesity (BMI 30.0-34.9)    PLAN:    In order of problems listed above:  Coronary artery disease: Secondary prevention stressed to the patient.  Importance of compliance with diet medication stressed and she vocalized understanding.  She was advised to walk at least half an hour a day on a daily basis. Essential hypertension: Blood pressure is stable and diet was emphasized.  Lifestyle modification urged. Mixed dyslipidemia: On lipid-lowering medications LDL is greater than 120 I told her to double statin and she wants to check this with her primary doctor.  Diet emphasized.  Goal LDL must be less than 60. Obesity: Weight reduction stressed diet emphasized and she promises to do better. Patient will be seen in follow-up appointment in 9 months or earlier if the patient has any concerns.    Medication Adjustments/Labs and Tests Ordered: Current medicines are reviewed at length with the patient today.  Concerns regarding medicines are outlined above.  No orders of the defined types were placed in this encounter.  No orders of the defined types were placed in this encounter.    No chief complaint on file.    History of Present Illness:    Dana Velazquez is a 78 y.o. female.  Patient has past medical history of coronary artery disease, essential hypertension, mixed dyslipidemia and obesity.  She leads a sedentary lifestyle.  She denies any chest pain orthopnea or PND.  At the time of my evaluation, the patient is alert awake oriented and in no distress.  Past Medical History:  Diagnosis Date    Acute medial meniscus tear of left knee    Acute suppurative otitis media of left ear with spontaneous rupture of tympanic membrane 11/19/2020   Allergic rhinitis, seasonal    Angina pectoris (HCC) 02/27/2020   Arthritis    right big toe, little finger right hand, right wrist   Benign essential hypertension    Cervical spine arthritis    Chest pain    Chronic cough    Chronic eczematous otitis externa of left ear 03/20/2017   Chronic pain in left foot 07/26/2016   Chronic right-sided low back pain without sciatica    Constipation    Coronary artery disease 06/02/2020   DDD (degenerative disc disease), lumbar    Degenerative cervical spinal stenosis    Severe at C6, C7   Edema of left lower extremity    ETD (Eustachian tube dysfunction), left 12/01/2020   Fall 09/20/2021   GERD (gastroesophageal reflux disease)    Impacted cerumen of left ear 03/20/2017   Left ankle tendonitis 10/08/2020   Left leg cellulitis 09/26/2021   resolved     Lichen sclerosus    inside vagina   Lumbar radiculopathy    Menopausal flushing    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   Mixed dyslipidemia 02/27/2020   MRSA (methicillin resistant Staphylococcus aureus) 12/01/2020   OAB (overactive bladder)    Obesity (BMI 30.0-34.9) 02/27/2020   Osteoporosis of multiple sites    Otorrhea of left ear 11/19/2020  Pain and swelling of ankle, left 10/08/2020   Peroneal tendinitis 12/17/2019   Primary osteoarthritis of left foot 07/26/2016   RLS (restless legs syndrome)    Skin abnormality    13 stitches in left leg after fall 09-20-2021   Sleep apnea    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   UTI (urinary tract infection) 07/01/2022   started  ciprofloxacin  10-11-2021     Vitamin D  deficiency    Wears glasses     Past Surgical History:  Procedure Laterality Date   ABDOMINAL HYSTERECTOMY  1988   partial   BOTOX  INJECTION N/A 10/26/2021   Procedure: BOTOX  INJECTION 100  UNITS;  Surgeon: Elisabeth Valli BIRCH, MD;  Location: [facility];  Service: Urology;  Laterality: N/A;   BUNIONECTOMY Bilateral    yrs ago   C 5 to C 7 neck surgery  2022   with plates and screws   CATARACT EXTRACTION, BILATERAL  08/2019   CERVICAL SPINE SURGERY  1998   C 6 to C 7 dr philipp   COLONOSCOPY WITH PROPOFOL  N/A 07/03/2012   Procedure: COLONOSCOPY WITH PROPOFOL ;  Surgeon: Gladis MARLA Louder, MD;  Location: [facility];  Service: Endoscopy;  Laterality: N/A;   CYSTOSCOPY N/A 10/26/2021   Procedure: CYSTOSCOPY;  Surgeon: Elisabeth Valli BIRCH, MD;  Location: [facility];  Service: Urology;  Laterality: N/A;  30 MNS   CYSTOSCOPY WITH INJECTION N/A 10/04/2022   Procedure: CYSTOSCOPY WITH BOTOX  100 UNITS, AND BULKAMID INJECTION;  Surgeon: Elisabeth Valli BIRCH, MD;  Location: [facility];  Service: Urology;  Laterality: N/A;  45 MINUTES   EYE SURGERY Left 2014   To open tear duct   KNEE SURGERY Left 10/10/2014   Dr. Marleen medial meniscectomy with chondroplasty of medial femoral condyle   left ear tube placement  12/2020   lumbar back surgery l 5 to l 6   01/25/2022   REPLACEMENT TOTAL KNEE Left 12/13/2016   Dr. Larnell    Current Medications: Current Meds  Medication Sig   acetaminophen  (TYLENOL ) 500 MG tablet Take 1,000 mg by mouth every 6 (six) hours as needed for mild pain (pain score 1-3) or moderate pain (pain score 4-6).   alendronate (FOSAMAX) 70 MG tablet Take 1 tablet by mouth once a week.   aspirin  EC 81 MG tablet Take 81 mg by mouth daily. Swallow whole.   atorvastatin (LIPITOR) 40 MG tablet Take 40 mg by mouth at bedtime.   Cholecalciferol (VITAMIN D ) 50 MCG (2000 UT) CAPS Take 2,000 Units by mouth daily.   clobetasol cream (TEMOVATE) 0.05 % Apply 1 application  topically as needed (itching).   clobetasol ointment (TEMOVATE) 0.05 % Apply 1 Application topically as needed (itching).   Cyanocobalamin (VITAMIN B 12 PO) Take 1,000  mg by mouth daily.   esomeprazole (NEXIUM) 40 MG capsule Take 1 capsule by mouth 2 (two) times daily before a meal.   ezetimibe (ZETIA) 10 MG tablet Take 10 mg by mouth daily.   hydrOXYzine (ATARAX) 10 MG tablet Take 10 mg by mouth 3 (three) times daily as needed for anxiety.   ibuprofen (ADVIL) 200 MG tablet Take 200 mg by mouth every 6 (six) hours as needed for mild pain (pain score 1-3) or moderate pain (pain score 4-6).   nitroGLYCERIN  (NITROSTAT ) 0.4 MG SL tablet Place 0.4 mg under the tongue every 5 (five) minutes as needed for chest pain.   nystatin cream (MYCOSTATIN) Apply 1 Application topically as needed  for dry skin.   Thiamine HCl (VITAMIN B-1 PO) Take 1 tablet by mouth daily.     Allergies:   Penicillins, Prednisone, and Sulfa antibiotics   Social History   Socioeconomic History   Marital status: Married    Spouse name: Not on file   Number of children: Not on file   Years of education: Not on file   Highest education level: Not on file  Occupational History   Not on file  Tobacco Use   Smoking status: Never   Smokeless tobacco: Never  Vaping Use   Vaping status: Never Used  Substance and Sexual Activity   Alcohol use: Not Currently   Drug use: No   Sexual activity: Not on file  Other Topics Concern   Not on file  Social History Narrative   Not on file   Social Drivers of Health   Financial Resource Strain: Not on file  Food Insecurity: Low Risk  (06/06/2023)   Received from [facility]   Hunger Vital Sign    Within the past 12 months, you worried that your food would run out before you got money to buy more: Never true    Within the past 12 months, the food you bought just didn't last and you didn't have money to get more. : Never true  Transportation Needs: No Transportation Needs (06/06/2023)   Received from [facility]    In the past 12 months, has lack of reliable transportation kept you from medical appointments, meetings, work or  from getting things needed for daily living? : No  Physical Activity: Not on file  Stress: Not on file  Social Connections: Not on file     Family History: The patient's family history includes CAD in her father; COPD in her mother; Heart attack in her father; Stomach cancer in her maternal grandmother.  ROS:   Please see the history of present illness.    All other systems reviewed and are negative.  EKGs/Labs/Other Studies Reviewed:    The following studies were reviewed today: I discussed my findings with the patient at length   Recent Labs: 10/04/2022: BUN 14; Creatinine, Ser 0.70; Hemoglobin 15.3; Potassium 4.2; Sodium 143  Recent Lipid Panel    Component Value Date/Time   CHOL 154 08/05/2021 0818   TRIG 121 08/05/2021 0818   HDL 48 08/05/2021 0818   CHOLHDL 3.2 08/05/2021 0818   LDLCALC 84 08/05/2021 0818    Physical Exam:    VS:  BP 128/78   Pulse 64   Ht 4' 11 (1.499 m)   Wt 160 lb 9.6 oz (72.8 kg)   SpO2 96%   BMI 32.44 kg/m     Wt Readings from Last 3 Encounters:  07/13/23 160 lb 9.6 oz (72.8 kg)  10/04/22 157 lb 12.8 oz (71.6 kg)  07/18/22 157 lb (71.2 kg)     GEN: Patient is in no acute distress HEENT: Normal NECK: No JVD; No carotid bruits LYMPHATICS: No lymphadenopathy CARDIAC: Hear sounds regular, 2/6 systolic murmur at the apex. RESPIRATORY:  Clear to auscultation without rales, wheezing or rhonchi  ABDOMEN: Soft, non-tender, non-distended MUSCULOSKELETAL:  No edema; No deformity  SKIN: Warm and dry NEUROLOGIC:  Alert and oriented x 3 PSYCHIATRIC:  Normal affect   Signed, Jennifer JONELLE Crape, MD  07/13/2023 3:42 PM    [facility]

## 2023-07-13 NOTE — Patient Instructions (Signed)

## 2023-10-23 ENCOUNTER — Ambulatory Visit: Payer: Self-pay | Admitting: Allergy and Immunology

## 2024-02-14 ENCOUNTER — Ambulatory Visit (HOSPITAL_BASED_OUTPATIENT_CLINIC_OR_DEPARTMENT_OTHER)
Admission: EM | Admit: 2024-02-14 | Discharge: 2024-02-14 | Disposition: A | Discharge status: Home / Self Care | Attending: Family Medicine | Admitting: Family Medicine

## 2024-02-14 ENCOUNTER — Other Ambulatory Visit (HOSPITAL_BASED_OUTPATIENT_CLINIC_OR_DEPARTMENT_OTHER): Payer: Self-pay

## 2024-02-14 ENCOUNTER — Encounter (HOSPITAL_BASED_OUTPATIENT_CLINIC_OR_DEPARTMENT_OTHER): Payer: Self-pay | Admitting: Emergency Medicine

## 2024-02-14 ENCOUNTER — Ambulatory Visit (HOSPITAL_BASED_OUTPATIENT_CLINIC_OR_DEPARTMENT_OTHER): Admit: 2024-02-14 | Discharge: 2024-02-14 | Disposition: A | Admitting: Radiology

## 2024-02-14 DIAGNOSIS — W010XXA Fall on same level from slipping, tripping and stumbling without subsequent striking against object, initial encounter: Secondary | ICD-10-CM | POA: Diagnosis not present

## 2024-02-14 DIAGNOSIS — M62830 Muscle spasm of back: Secondary | ICD-10-CM

## 2024-02-14 DIAGNOSIS — M5442 Lumbago with sciatica, left side: Secondary | ICD-10-CM | POA: Diagnosis not present

## 2024-02-14 MED ORDER — KETOROLAC TROMETHAMINE 10 MG PO TABS
10.0000 mg | ORAL_TABLET | Freq: Four times a day (QID) | ORAL | 0 refills | Status: AC | PRN
  Filled 2024-02-14: qty 20, 5d supply, fill #0

## 2024-02-14 MED ORDER — CYCLOBENZAPRINE HCL 5 MG PO TABS
5.0000 mg | ORAL_TABLET | Freq: Every evening | ORAL | 0 refills | Status: AC | PRN
  Filled 2024-02-14: qty 15, 15d supply, fill #0

## 2024-02-14 MED ORDER — KETOROLAC TROMETHAMINE 30 MG/ML IJ SOLN
30.0000 mg | Freq: Once | INTRAMUSCULAR | Status: AC
  Administered 2024-02-14: 30 mg via INTRAMUSCULAR

## 2024-02-14 NOTE — ED Provider Notes (Signed)
 " [facility]    CSN: [id number] Arrival date & time: 02/14/24  0855      History   Chief Complaint Chief Complaint  Patient presents with   Fall   Back Pain    HPI Dana Velazquez is a 79 y.o. female.   79 year old female with her adult daughter here.  She was working in her closet on her husband's side of the closet, organizing and she stepped backwards and fell off of a stepstool.  She heard a pop when the injury happened.  She fell backwards against some close but landed up on her bottom and back on the floor of the closet.  She did not lose consciousness.  The injury happened on 02/13/2024.  She is having persistent lower back pain and having trouble sitting for any length of time.  She denies saddle anesthesia, weakness in either leg, loss of bladder control nor loss of bowel control.   Fall Pertinent negatives include no chest pain and no abdominal pain.  Back Pain Associated symptoms: no abdominal pain, no chest pain and no fever     Past Medical History:  Diagnosis Date   Acute medial meniscus tear of left knee    Acute suppurative otitis media of left ear with spontaneous rupture of tympanic membrane 11/19/2020   Allergic rhinitis, seasonal    Angina pectoris 02/27/2020   Arthritis    right big toe, little finger right hand, right wrist   Benign essential hypertension    Cervical spine arthritis    Chest pain    Chronic cough    Chronic eczematous otitis externa of left ear 03/20/2017   Chronic pain in left foot 07/26/2016   Chronic right-sided low back pain without sciatica    Constipation    Coronary artery disease 06/02/2020   DDD (degenerative disc disease), lumbar    Degenerative cervical spinal stenosis    Severe at C6, C7   Edema of left lower extremity    ETD (Eustachian tube dysfunction), left 12/01/2020   Fall 09/20/2021   GERD (gastroesophageal reflux disease)    Impacted cerumen of left ear 03/20/2017   Left ankle tendonitis 10/08/2020    Left leg cellulitis 09/26/2021   resolved     Lichen sclerosus    inside vagina   Lumbar radiculopathy    Menopausal flushing    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   Mixed dyslipidemia 02/27/2020   MRSA (methicillin resistant Staphylococcus aureus) 12/01/2020   OAB (overactive bladder)    Obesity (BMI 30.0-34.9) 02/27/2020   Osteoporosis of multiple sites    Otorrhea of left ear 11/19/2020   Pain and swelling of ankle, left 10/08/2020   Peroneal tendinitis 12/17/2019   Primary osteoarthritis of left foot 07/26/2016   RLS (restless legs syndrome)    Skin abnormality    13 stitches in left leg after fall 09-20-2021   Sleep apnea    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   UTI (urinary tract infection) 07/01/2022   started  ciprofloxacin  10-11-2021     Vitamin D  deficiency    Wears glasses     Patient Active Problem List   Diagnosis Date Noted   Skin abnormality 07/01/2022   Sleep apnea 07/01/2022   UTI (urinary tract infection) 07/01/2022   Wears glasses 07/01/2022   Left leg cellulitis 09/26/2021   Fall 09/20/2021   Mixed conductive and sensorineural hearing loss of left ear with restricted hearing of right ear 12/06/2020  ETD (Eustachian tube dysfunction), left 12/01/2020   MRSA (methicillin resistant Staphylococcus aureus) 12/01/2020   Acute suppurative otitis media of left ear with spontaneous rupture of tympanic membrane 11/19/2020   Otorrhea of left ear 11/19/2020   Pain and swelling of ankle, left 10/08/2020   Left ankle tendonitis 10/08/2020   Coronary artery disease 06/02/2020   Angina pectoris 02/27/2020   Mixed dyslipidemia 02/27/2020   Obesity (BMI 30.0-34.9) 02/27/2020   Vitamin D  deficiency    RLS (restless legs syndrome)    Osteoporosis of multiple sites    OAB (overactive bladder)    Lumbar radiculopathy    Lichen sclerosus    GERD (gastroesophageal reflux disease)    Edema of left lower extremity    Degenerative  cervical spinal stenosis    DDD (degenerative disc disease), lumbar    Chronic right-sided low back pain without sciatica    Cervical spine arthritis    Benign essential hypertension    Arthritis    Allergic rhinitis, seasonal    Acute medial meniscus tear of left knee    Chronic cough    Constipation    Menopausal flushing    Chest pain    Peroneal tendinitis 12/17/2019   Chronic eczematous otitis externa of left ear 03/20/2017   Impacted cerumen of left ear 03/20/2017   Chronic pain in left foot 07/26/2016   Primary osteoarthritis of left foot 07/26/2016    Past Surgical History:  Procedure Laterality Date   ABDOMINAL HYSTERECTOMY  1988   partial   BOTOX  INJECTION N/A 10/26/2021   Procedure: BOTOX  INJECTION 100 UNITS;  Surgeon: Elisabeth Valli BIRCH, MD;  Location: [facility];  Service: Urology;  Laterality: N/A;   BUNIONECTOMY Bilateral    yrs ago   C 5 to C 7 neck surgery  2022   with plates and screws   CATARACT EXTRACTION, BILATERAL  08/2019   CERVICAL SPINE SURGERY  1998   C 6 to C 7 dr philipp   COLONOSCOPY WITH PROPOFOL  N/A 07/03/2012   Procedure: COLONOSCOPY WITH PROPOFOL ;  Surgeon: Gladis MARLA Louder, MD;  Location: [facility];  Service: Endoscopy;  Laterality: N/A;   CYSTOSCOPY N/A 10/26/2021   Procedure: CYSTOSCOPY;  Surgeon: Elisabeth Valli BIRCH, MD;  Location: [facility];  Service: Urology;  Laterality: N/A;  30 MNS   CYSTOSCOPY WITH INJECTION N/A 10/04/2022   Procedure: CYSTOSCOPY WITH BOTOX  100 UNITS, AND BULKAMID INJECTION;  Surgeon: Elisabeth Valli BIRCH, MD;  Location: [facility];  Service: Urology;  Laterality: N/A;  45 MINUTES   EYE SURGERY Left 2014   To open tear duct   KNEE SURGERY Left 10/10/2014   Dr. Marleen medial meniscectomy with chondroplasty of medial femoral condyle   left ear tube placement  12/2020   lumbar back surgery l 5 to l 6   01/25/2022   REPLACEMENT TOTAL KNEE Left 12/13/2016   Dr. Larnell     OB History   No obstetric history on file.      Home Medications    Prior to Admission medications  Medication Sig Start Date End Date Taking? Authorizing Provider  alendronate (FOSAMAX) 70 MG tablet Take 1 tablet by mouth once a week. 08/31/16  Yes [provider]  aspirin  EC 81 MG tablet Take 81 mg by mouth daily. Swallow whole.   Yes [provider]  atorvastatin (LIPITOR) 40 MG tablet Take 40 mg by mouth at bedtime. 06/27/16  Yes [provider]  Cholecalciferol (VITAMIN D )  50 MCG (2000 UT) CAPS Take 2,000 Units by mouth daily.   Yes [provider]  Cyanocobalamin (VITAMIN B 12 PO) Take 1,000 mg by mouth daily.   Yes [provider]  cyclobenzaprine  (FLEXERIL ) 5 MG tablet Take 1 tablet (5 mg total) by mouth at bedtime as needed for up to 15 days for muscle spasms. 02/14/24 02/29/24 Yes Ival Domino, FNP  esomeprazole (NEXIUM) 40 MG capsule Take 1 capsule by mouth 2 (two) times daily before a meal. 06/11/16  Yes [provider]  ezetimibe (ZETIA) 10 MG tablet Take 10 mg by mouth daily. 07/14/21  Yes [provider]  ketorolac  (TORADOL ) 10 MG tablet Take 1 tablet (10 mg total) by mouth every 6 (six) hours as needed. 02/14/24  Yes Ival Domino, FNP  acetaminophen  (TYLENOL ) 500 MG tablet Take 1,000 mg by mouth every 6 (six) hours as needed for mild pain (pain score 1-3) or moderate pain (pain score 4-6).    [provider]  clobetasol cream (TEMOVATE) 0.05 % Apply 1 application  topically as needed (itching). 12/25/19   [provider]  clobetasol ointment (TEMOVATE) 0.05 % Apply 1 Application topically as needed (itching).    [provider]  hydrOXYzine (ATARAX) 10 MG tablet Take 10 mg by mouth 3 (three) times daily as needed for anxiety.    [provider]  ibuprofen (ADVIL) 200 MG tablet Take 200 mg by mouth every 6 (six) hours as needed for mild pain (pain score 1-3) or moderate pain (pain  score 4-6).    [provider]  nitroGLYCERIN  (NITROSTAT ) 0.4 MG SL tablet Place 0.4 mg under the tongue every 5 (five) minutes as needed for chest pain.    [provider]  nystatin cream (MYCOSTATIN) Apply 1 Application topically as needed for dry skin.    [provider]  Thiamine HCl (VITAMIN B-1 PO) Take 1 tablet by mouth daily.    [provider]    Family History Family History  Problem Relation Age of Onset   Heart attack Father    CAD Father    COPD Mother    Stomach cancer Maternal Grandmother     Social History Social History[1]   Allergies   Penicillins and Sulfa antibiotics   Review of Systems Review of Systems  Constitutional:  Negative for fever.  Respiratory:  Negative for cough.   Cardiovascular:  Negative for chest pain.  Gastrointestinal:  Negative for abdominal pain, constipation, diarrhea, nausea and vomiting.  Musculoskeletal:  Positive for back pain. Negative for arthralgias.  Skin:  Negative for color change and rash.  Neurological:  Negative for syncope.  All other systems reviewed and are negative.    Physical Exam Triage Vital Signs ED Triage Vitals  Encounter Vitals Group     BP 02/14/24 0924 (!) 144/80     Girls Systolic BP Percentile --      Girls Diastolic BP Percentile --      Boys Systolic BP Percentile --      Boys Diastolic BP Percentile --      Pulse Rate 02/14/24 0924 70     Resp 02/14/24 0924 18     Temp 02/14/24 0924 (!) 97.5 F (36.4 C)     Temp Source 02/14/24 0924 Oral     SpO2 02/14/24 0924 96 %     Weight --      Height --      Head Circumference --      Peak Flow --  Pain Score 02/14/24 0923 10     Pain Loc --      Pain Education --      Exclude from Growth Chart --    No data found.  Updated Vital Signs BP (!) 144/80 (BP Location: Right Arm)   Pulse 70   Temp (!) 97.5 F (36.4 C) (Oral)   Resp 18   SpO2 96%   Visual Acuity Right Eye Distance:   Left Eye  Distance:   Bilateral Distance:    Right Eye Near:   Left Eye Near:    Bilateral Near:     Physical Exam Vitals and nursing note reviewed.  Constitutional:      General: She is not in acute distress.    Appearance: She is well-developed. She is ill-appearing (Patient appears to be in moderate or worse pain.  She is pacing the room and not able to sit for long periods of time.). She is not toxic-appearing or diaphoretic.  HENT:     Head: Normocephalic and atraumatic.     Right Ear: Hearing, tympanic membrane, ear canal and external ear normal.     Left Ear: Hearing, tympanic membrane, ear canal and external ear normal.     Nose: No congestion or rhinorrhea.     Right Sinus: No maxillary sinus tenderness or frontal sinus tenderness.     Left Sinus: No maxillary sinus tenderness or frontal sinus tenderness.     Mouth/Throat:     Lips: Pink.     Mouth: Mucous membranes are moist.     Pharynx: Uvula midline. No oropharyngeal exudate or posterior oropharyngeal erythema.     Tonsils: No tonsillar exudate.  Eyes:     Conjunctiva/sclera: Conjunctivae normal.     Pupils: Pupils are equal, round, and reactive to light.  Cardiovascular:     Rate and Rhythm: Normal rate and regular rhythm.     Heart sounds: S1 normal and S2 normal. No murmur heard. Pulmonary:     Effort: Pulmonary effort is normal. No respiratory distress.     Breath sounds: Normal breath sounds. No decreased breath sounds, wheezing, rhonchi or rales.  Abdominal:     General: Bowel sounds are normal.     Palpations: Abdomen is soft.     Tenderness: There is no abdominal tenderness.  Musculoskeletal:        General: No swelling.     Cervical back: Normal and neck supple.     Thoracic back: Normal.     Lumbar back: Spasms, tenderness and bony tenderness present. No swelling, edema, deformity, signs of trauma or lacerations. Normal range of motion. No scoliosis.     Comments: No ecchymosis noted.  Lymphadenopathy:      Head:     Right side of head: No submental, submandibular, tonsillar, preauricular or posterior auricular adenopathy.     Left side of head: No submental, submandibular, tonsillar, preauricular or posterior auricular adenopathy.     Cervical: No cervical adenopathy.     Right cervical: No superficial cervical adenopathy.    Left cervical: No superficial cervical adenopathy.  Skin:    General: Skin is warm and dry.     Capillary Refill: Capillary refill takes less than 2 seconds.     Findings: No rash.  Neurological:     Mental Status: She is alert and oriented to person, place, and time.     Deep Tendon Reflexes: Reflexes are normal and symmetric.  Psychiatric:        Mood and  Affect: Mood normal.      UC Treatments / Results  Labs (all labs ordered are listed, but only abnormal results are displayed) Basic Metabolic Panel: 01/26/22: Order: [id number] Component Ref Range & Units 2 yr ago  Sodium 135 - 146 MMOL/L 138  Potassium 3.5 - 5.3 MMOL/L 4.7  Chloride 98 - 110 MMOL/L 110  CO2 23 - 30 MMOL/L 24  BUN 8 - 24 MG/DL 15  Glucose 70 - 99 MG/DL 877 High   Comment: Patients taking eltrombopag at doses >/= 100 mg daily may show falsely elevated values of 10% or greater.  Creatinine 0.50 - 1.50 MG/DL 9.41  Calcium 8.5 - 89.4 MG/DL 8.0 Low   Anion Gap 4 - 14 MMOL/L 4  Est. GFR >=60 ML/MIN/1.73 M*2 >90  Comment: GFR estimated by CKD-EPI equations(NKF 2021).  Recommend confirmation of Cr-based eGFR by using Cys-based eGFR and other filtration markers (if applicable) in complex cases and clinical decision-making, as needed.  Resulting Agency [facility]    EKG   Radiology DG Lumbar Spine Complete Result Date: 02/14/2024 CLINICAL DATA:  Low back pain. EXAM: LUMBAR SPINE - COMPLETE 4+ VIEW COMPARISON:  Lumbar spine radiographs 02/07/2019. Lumbar MRI 01/27/2021. FINDINGS: There are 5 lumbar type vertebral bodies. Interval L4-5 PLIF with intact hardware. The  alignment is stable with a mild convex right scoliosis. Grossly stable chronic T12 compression deformity. Interval development of an inferior endplate compression deformity at T11. No evidence of acute lumbar spine fracture or traumatic subluxation. Grossly unchanged multilevel spondylosis. IMPRESSION: 1. No acute lumbar spine findings seen. Interval development of an inferior endplate compression deformity at T11, age indeterminate. 2. Interval L4-5 PLIF with intact hardware. 3. Grossly stable chronic T12 compression deformity. Electronically Signed   By: Elsie Perone M.D.   On: 02/14/2024 11:10    Procedures Procedures (including critical care time)  Medications Ordered in UC Medications  ketorolac  (TORADOL ) 30 MG/ML injection 30 mg (30 mg Intramuscular Given 02/14/24 1017)    Initial Impression / Assessment and Plan / UC Course  I have reviewed the triage vital signs and the nursing notes.  Pertinent labs & imaging results that were available during my care of the patient were reviewed by me and considered in my medical decision making (see chart for details).  Plan of Care (see discharge instructions for additional patient precautions and education): Lower back pain and muscle spasms: Patient is reporting the pain is more on her right lower back than her left but on exam it was on the left side that the pain was reproduced.  Some improvement in pain with ketorolac  injection but not a huge amount.  Ketorolac  10 mg every 6 hours if needed for pain.  Take the ketorolac  with food.  Cyclobenzaprine , 5 mg, nightly for muscle spasms.  Do not use and drive.  If pain is significant can take cyclobenzaprine  every 12 hours.  Encouraged ice packs or heat to the back as needed.  30 minutes on and 30 minutes off for either the ice or the heat.  If symptoms do not improve, worsen or new symptoms occur, see primary care.  May need physical therapy or referral to orthopedics for further workup.  I  reviewed the plan of care with the patient and/or the patient's guardian.  The patient and/or guardian had time to ask questions and acknowledged that the questions were answered.  Final Clinical Impressions(s) / UC Diagnoses   Final diagnoses:  Acute left-sided low back pain with  left-sided sciatica  Back muscle spasm  Fall on same level from slipping, tripping or stumbling, initial encounter     Discharge Instructions      Lower back pain and muscle spasms: Patient is reporting the pain is more on her right lower back than her left but on exam it was on the left side that the pain was reproduced.  Some improvement in pain with ketorolac  injection but not a huge amount.  Ketorolac  10 mg every 6 hours if needed for pain.  Take the ketorolac  with food.  Cyclobenzaprine , 5 mg, nightly for muscle spasms.  Do not use and drive.  If pain is significant can take cyclobenzaprine  every 12 hours.  Encouraged ice packs or heat to the back as needed.  30 minutes on and 30 minutes off for either the ice or the heat.  If symptoms do not improve, worsen or new symptoms occur, see primary care.  May need physical therapy or referral to orthopedics for further workup.     ED Prescriptions     Medication Sig Dispense Auth. Provider   ketorolac  (TORADOL ) 10 MG tablet Take 1 tablet (10 mg total) by mouth every 6 (six) hours as needed. 20 tablet Cris Gibby, FNP   cyclobenzaprine  (FLEXERIL ) 5 MG tablet Take 1 tablet (5 mg total) by mouth at bedtime as needed for up to 15 days for muscle spasms. 15 tablet Mayjor Ager, FNP      PDMP not reviewed this encounter.    [1]  Social History Tobacco Use   Smoking status: Never   Smokeless tobacco: Never  Vaping Use   Vaping status: Never Used  Substance Use Topics   Alcohol use: Not Currently   Drug use: No     Ival Domino, FNP 02/14/24 1119  "

## 2024-02-14 NOTE — ED Triage Notes (Signed)
 Pt was on a step stool in the house cleaning in the top part of her closet and when she stepped down her foot got tangled up and she fell on her back, she reports she did hear a pop when it happened. Pt did not loose consciousness

## 2024-02-14 NOTE — Discharge Instructions (Addendum)
 Lower back pain and muscle spasms: Patient is reporting the pain is more on her right lower back than her left but on exam it was on the left side that the pain was reproduced.  Some improvement in pain with ketorolac  injection but not a huge amount.  Ketorolac  10 mg every 6 hours if needed for pain.  Take the ketorolac  with food.  Cyclobenzaprine , 5 mg, nightly for muscle spasms.  Do not use and drive.  If pain is significant can take cyclobenzaprine  every 12 hours.  Encouraged ice packs or heat to the back as needed.  30 minutes on and 30 minutes off for either the ice or the heat.  If symptoms do not improve, worsen or new symptoms occur, see primary care.  May need physical therapy or referral to orthopedics for further workup.
